# Patient Record
Sex: Male | Born: 1935
Health system: Southern US, Community
[De-identification: ages and names within clinical notes are randomized; demographics above are authoritative.]

## PROBLEM LIST (undated history)

## (undated) DIAGNOSIS — E785 Hyperlipidemia, unspecified: Secondary | ICD-10-CM

## (undated) DIAGNOSIS — I2510 Atherosclerotic heart disease of native coronary artery without angina pectoris: Secondary | ICD-10-CM

## (undated) DIAGNOSIS — I10 Essential (primary) hypertension: Secondary | ICD-10-CM

## (undated) HISTORY — DX: Essential (primary) hypertension: I10

## (undated) HISTORY — DX: Hyperlipidemia, unspecified: E78.5

## (undated) HISTORY — DX: Atherosclerotic heart disease of native coronary artery without angina pectoris: I25.10

---

## 1984-10-09 HISTORY — PX: KNEE SURGERY: SHX244

## 1998-01-26 ENCOUNTER — Inpatient Hospital Stay (HOSPITAL_COMMUNITY): Admission: EM | Admit: 1998-01-26 | Discharge: 1998-02-15 | Payer: Self-pay | Admitting: Emergency Medicine

## 1998-02-01 HISTORY — PX: CORONARY ARTERY BYPASS GRAFT: SHX141

## 2001-01-28 ENCOUNTER — Ambulatory Visit (HOSPITAL_COMMUNITY): Admission: RE | Admit: 2001-01-28 | Discharge: 2001-01-29 | Payer: Self-pay | Admitting: Cardiovascular Disease

## 2010-10-19 HISTORY — PX: US ECHOCARDIOGRAPHY: HXRAD669

## 2012-03-08 HISTORY — PX: NM MYOCAR PERF WALL MOTION: HXRAD629

## 2013-03-19 ENCOUNTER — Encounter: Payer: Self-pay | Admitting: Cardiovascular Disease

## 2013-03-31 ENCOUNTER — Telehealth: Payer: Self-pay | Admitting: Cardiovascular Disease

## 2013-03-31 NOTE — Telephone Encounter (Signed)
Wants lab results from about 2wks-going out of town on [place] like to know before he goes!

## 2013-03-31 NOTE — Telephone Encounter (Signed)
Message forwarded to W. Waddell, CMA.  

## 2013-04-23 ENCOUNTER — Telehealth: Payer: Self-pay | Admitting: Cardiovascular Disease

## 2013-04-23 NOTE — Telephone Encounter (Signed)
Wants lab results from about 2 months ago!

## 2013-04-24 NOTE — Telephone Encounter (Signed)
Informed patient Dr Tresa Endo hasn't reviewed labs yet, but there's nothing alarming on it that i could tell. If he wants him to do anything differently after he reviews labs, i will call back to inform.

## 2013-05-07 ENCOUNTER — Encounter: Payer: Self-pay | Admitting: *Deleted

## 2013-05-07 ENCOUNTER — Other Ambulatory Visit (HOSPITAL_COMMUNITY): Payer: Self-pay | Admitting: Cardiovascular Disease

## 2013-05-07 DIAGNOSIS — I2510 Atherosclerotic heart disease of native coronary artery without angina pectoris: Secondary | ICD-10-CM

## 2013-05-14 ENCOUNTER — Ambulatory Visit (HOSPITAL_COMMUNITY)
Admission: RE | Admit: 2013-05-14 | Discharge: 2013-05-14 | Disposition: A | Discharge status: Home / Self Care | Payer: Medicare Other | Source: Ambulatory Visit | Attending: Cardiovascular Disease | Admitting: Cardiovascular Disease

## 2013-05-14 DIAGNOSIS — I059 Rheumatic mitral valve disease, unspecified: Secondary | ICD-10-CM | POA: Insufficient documentation

## 2013-05-14 DIAGNOSIS — I517 Cardiomegaly: Secondary | ICD-10-CM | POA: Insufficient documentation

## 2013-05-14 DIAGNOSIS — I079 Rheumatic tricuspid valve disease, unspecified: Secondary | ICD-10-CM | POA: Insufficient documentation

## 2013-05-14 DIAGNOSIS — I2510 Atherosclerotic heart disease of native coronary artery without angina pectoris: Secondary | ICD-10-CM

## 2013-05-14 DIAGNOSIS — I359 Nonrheumatic aortic valve disorder, unspecified: Secondary | ICD-10-CM | POA: Insufficient documentation

## 2013-05-14 NOTE — Progress Notes (Signed)
[facility]   2D echo completed 05/14/2013.   Cindy Dixie Jafri, RDCS  

## 2013-05-23 ENCOUNTER — Encounter: Payer: Self-pay | Admitting: *Deleted

## 2013-06-04 ENCOUNTER — Other Ambulatory Visit (HOSPITAL_COMMUNITY): Payer: Self-pay | Admitting: Cardiovascular Disease

## 2013-06-05 NOTE — Telephone Encounter (Signed)
Rx was sent to pharmacy electronically. 

## 2013-06-18 ENCOUNTER — Other Ambulatory Visit (HOSPITAL_COMMUNITY): Payer: Self-pay | Admitting: Cardiovascular Disease

## 2013-08-11 ENCOUNTER — Other Ambulatory Visit (HOSPITAL_COMMUNITY): Payer: Self-pay | Admitting: Cardiovascular Disease

## 2013-08-11 NOTE — Telephone Encounter (Signed)
Rx was sent to pharmacy electronically. 

## 2013-09-01 ENCOUNTER — Other Ambulatory Visit (HOSPITAL_COMMUNITY): Payer: Self-pay | Admitting: Cardiovascular Disease

## 2013-09-01 NOTE — Telephone Encounter (Signed)
Rx was sent to pharmacy electronically. 

## 2013-09-08 ENCOUNTER — Ambulatory Visit (INDEPENDENT_AMBULATORY_CARE_PROVIDER_SITE_OTHER): Payer: Medicare Other | Admitting: Cardiovascular Disease

## 2013-09-08 ENCOUNTER — Encounter: Payer: Self-pay | Admitting: Cardiovascular Disease

## 2013-09-08 VITALS — BP 130/78 | HR 53 | Ht 68.0 in | Wt 178.6 lb

## 2013-09-08 DIAGNOSIS — I10 Essential (primary) hypertension: Secondary | ICD-10-CM

## 2013-09-08 DIAGNOSIS — N289 Disorder of kidney and ureter, unspecified: Secondary | ICD-10-CM

## 2013-09-08 DIAGNOSIS — I350 Nonrheumatic aortic (valve) stenosis: Secondary | ICD-10-CM

## 2013-09-08 DIAGNOSIS — I359 Nonrheumatic aortic valve disorder, unspecified: Secondary | ICD-10-CM

## 2013-09-08 DIAGNOSIS — E785 Hyperlipidemia, unspecified: Secondary | ICD-10-CM

## 2013-09-08 DIAGNOSIS — I2510 Atherosclerotic heart disease of native coronary artery without angina pectoris: Secondary | ICD-10-CM

## 2013-09-08 NOTE — Patient Instructions (Signed)
Your physician wants you to follow-up in:  6 months. You will receive a reminder letter in the mail two months in advance. If you don't receive a letter, please call our office to schedule the follow-up appointment.   

## 2013-09-09 ENCOUNTER — Encounter: Payer: Self-pay | Admitting: Cardiovascular Disease

## 2013-09-09 DIAGNOSIS — N289 Disorder of kidney and ureter, unspecified: Secondary | ICD-10-CM | POA: Insufficient documentation

## 2013-09-09 DIAGNOSIS — I350 Nonrheumatic aortic (valve) stenosis: Secondary | ICD-10-CM | POA: Insufficient documentation

## 2013-09-09 DIAGNOSIS — E785 Hyperlipidemia, unspecified: Secondary | ICD-10-CM | POA: Insufficient documentation

## 2013-09-09 DIAGNOSIS — I2510 Atherosclerotic heart disease of native coronary artery without angina pectoris: Secondary | ICD-10-CM | POA: Insufficient documentation

## 2013-09-09 DIAGNOSIS — I10 Essential (primary) hypertension: Secondary | ICD-10-CM | POA: Insufficient documentation

## 2013-09-09 NOTE — Progress Notes (Signed)
Patient ID: Douglas Bray, male   DOB: [birth year], 79 y.o.   MRN: [id number]     HPI: Douglas Bray is a 79 y.o. male who presents for nine-month cardiology evaluation.  Douglas Bray has known coronary artery disease in April 1999 underwent CABG revascularization surgery LIMA to the LAD, vein to the diagonal, vein to the marginal, and vein to the PDA an optional diagonal vessel. In April 2002 he underwent complex 2 vessel intervention to the diagonal vessel with stenting as well as stenting to the circumflex graft. In January 2012 echo Doppler study suggested normal systolic function with diastolic dysfunction. He had moderate to severe tricuspid regurgitation with mild pulmonary hypertension, mild aortic insufficiency, mild mitral regurgitation.  In May 2013 2 episodes of left arm discomfort a nuclear perfusion study was performed which continued to show normal perfusion.  He recently underwent a echo Doppler study on 05/14/2013. This showed mild left jugular hypertrophy with normal systolic function. Diastolic parameters were normal. There was very mild aortic stenosis with mildly thickened trileaflet valve with mild AR. He had mild mitral regurgitation, moderately severe tricuspid regurgitation and mild pulmonary hypertension with a PA pressure 37 mm. His mean aortic gradient was 7 with a peak gradient of 16.  Additional problems include hypertension and hyperlipidemia.   Allergies  Allergen Reactions  . Ramipril Cough    Current Outpatient Prescriptions  Medication Sig Dispense Refill  . aspirin EC 81 MG tablet Take 81 mg by mouth daily.      Marland Kitchen atenolol (TENORMIN) 25 MG tablet Take 0.5 tablets (12.5 mg total) by mouth daily.  45 tablet  1  . Fish Oil OIL Take 1 capsule by mouth 2 (two) times daily.      . isosorbide mononitrate (IMDUR) 60 MG 24 hr tablet TAKE ONE (1) TABLET BY MOUTH EVERY DAY  90 tablet  0  . quinapril (ACCUPRIL) 5 MG tablet TAKE ONE (1) TABLET BY MOUTH EVERY  DAY  90 tablet  1  . simvastatin (ZOCOR) 40 MG tablet TAKE ONE (1) TABLET BY MOUTH EVERY DAY  90 tablet  2   No current facility-administered medications for this visit.    History   Social History  . Marital Status: Married    Spouse Name: N/A    Number of Children: N/A  . Years of Education: N/A   Occupational History  . Not on file.   Social History Main Topics  . Smoking status: Former Games developer  . Smokeless tobacco: Current User    Types: Chew     Comment: quit smoking 50 years ago. chews occas.  . Alcohol Use: No  . Drug Use: Not on file  . Sexual Activity: Not on file   Other Topics Concern  . Not on file   Social History Narrative  . No narrative on file   Social history is notable in that he is married has 2 children, one deceased, 5 grandchildren 8 great-grandchildren. There is no call use. He previously chewed tobacco. His wife did suffer a CVA.  History reviewed. No pertinent family history.  ROS is negative for fevers, chills or night sweats. He denies skin rash. He denies hearing issues. He denies awareness of lymphadenopathy. He denies cough increased sputum production. He denies wheezing. He denies shortness of breath. He denies presyncope or syncope. He denies chest pain. He denies nausea vomiting diarrhea. He denies blood in stool or urine. He denies myalgias. He denies paresthesias. There is no diabetes. He is  unaware of cold intolerance or other endocrine problems.   Other comprehensive 12 point system review is negative.  PE BP 130/78  Pulse 53  Ht 5\' 8"  (1.727 m)  Wt 178 lb 9.6 oz (81.012 kg)  BMI 27.16 kg/m2  General: Alert, oriented, no distress.  Skin: normal turgor, no rashes HEENT: Normocephalic, atraumatic. Pupils round and reactive; sclera anicteric;no lid lag.  Nose without nasal septal hypertrophy Mouth/Parynx benign; Mallinpatti scale 2 Neck: No JVD, no carotid briuts Lungs: clear to ausculatation and percussion; no wheezing or  rales Heart: RRR, s1 s2 normal 2/6 systolic murmur in aortic region concordant with his mild aortic stenosis. No S3 or S4 gallop or Abdomen: soft, nontender; no hepatosplenomehaly, BS+; abdominal aorta nontender and not dilated by palpation. Pulses 2+ Extremities: no clubbing cyanosis or edema, Homan's sign negative  Neurologic: grossly nonfocal Psychologic: normal affect and mood.  ECG: Sinus rhythm at 53 beats per minute. No ectopy. Normal intervals.  LABS:  BMET No results found for this basename: na, k, cl, co2, glucose, bun, creatinine, calcium, gfrnonaa, gfraa     Hepatic Function Panel  No results found for this basename: prot, albumin, ast, alt, alkphos, bilitot, bilidir, ibili     CBC No results found for this basename: wbc, rbc, hgb, hct, plt, mcv, mch, mchc, rdw, neutrabs, lymphsabs, monoabs, eosabs, basosabs     BNP No results found for this basename: probnp    Lipid Panel  No results found for this basename: chol, trig, hdl, cholhdl, vldl, ldlcalc     RADIOLOGY: No results found.    ASSESSMENT AND PLAN:  My impression is that Douglas Bray is an 79 year old gentleman who is now 15 years status post CBG revascularization surgery. His last nuclear perfusion study in May 2013 continued to show fairly normal perfusion without ischemia. I did review his recent echo Doppler study with him in detail. He does have very mild aortic stenosis and has normal systolic function. As last laboratory 6 months ago, cholesterol is excellent at 138 with an HDL 45 triglycerides 139 LDL cholesterol 65. Normal chemistry profile though cracking was minimally increased at 1.41. He continues to be very active with walking outside, and hunting. He continue current medical regimen. I will see him in 6 months for cardiology reevaluation.    Lennette Bihari, MD, [facility]  09/09/2013 3:24 PM

## 2013-09-10 ENCOUNTER — Encounter: Payer: Self-pay | Admitting: Cardiovascular Disease

## 2013-10-22 ENCOUNTER — Other Ambulatory Visit (HOSPITAL_COMMUNITY): Payer: Self-pay | Admitting: Cardiovascular Disease

## 2013-10-22 NOTE — Telephone Encounter (Signed)
Rx was sent to pharmacy electronically. 

## 2014-01-06 ENCOUNTER — Other Ambulatory Visit: Payer: Self-pay | Admitting: *Deleted

## 2014-01-06 ENCOUNTER — Telehealth: Payer: Self-pay | Admitting: *Deleted

## 2014-01-06 DIAGNOSIS — R5381 Other malaise: Secondary | ICD-10-CM

## 2014-01-06 DIAGNOSIS — E782 Mixed hyperlipidemia: Secondary | ICD-10-CM

## 2014-01-06 DIAGNOSIS — R5383 Other fatigue: Secondary | ICD-10-CM

## 2014-01-06 DIAGNOSIS — I10 Essential (primary) hypertension: Secondary | ICD-10-CM

## 2014-01-06 DIAGNOSIS — Z79899 Other long term (current) drug therapy: Secondary | ICD-10-CM

## 2014-01-06 NOTE — Telephone Encounter (Signed)
Informed patient lab slips will be sent to him.

## 2014-01-06 NOTE — Telephone Encounter (Signed)
Pt was calling in regards to his lab work. He stated that if he needs lab work if you can please send him a lab slip.  TK

## 2014-02-05 ENCOUNTER — Encounter: Payer: Self-pay | Admitting: *Deleted

## 2014-02-06 ENCOUNTER — Encounter: Payer: Self-pay | Admitting: Cardiovascular Disease

## 2014-02-06 ENCOUNTER — Ambulatory Visit (INDEPENDENT_AMBULATORY_CARE_PROVIDER_SITE_OTHER): Payer: Medicare Other | Admitting: Cardiovascular Disease

## 2014-02-06 VITALS — BP 112/62 | HR 53 | Ht 68.0 in | Wt 173.8 lb

## 2014-02-06 DIAGNOSIS — I359 Nonrheumatic aortic valve disorder, unspecified: Secondary | ICD-10-CM

## 2014-02-06 DIAGNOSIS — E785 Hyperlipidemia, unspecified: Secondary | ICD-10-CM

## 2014-02-06 DIAGNOSIS — I10 Essential (primary) hypertension: Secondary | ICD-10-CM

## 2014-02-06 DIAGNOSIS — I350 Nonrheumatic aortic (valve) stenosis: Secondary | ICD-10-CM

## 2014-02-06 DIAGNOSIS — I2510 Atherosclerotic heart disease of native coronary artery without angina pectoris: Secondary | ICD-10-CM

## 2014-02-06 NOTE — Patient Instructions (Signed)
Your physician has requested that you have an echocardiogram. Echocardiography is a painless test that uses sound waves to create images of your heart. It provides your doctor with information about the size and shape of your heart and how well your heart's chambers and valves are working. This procedure takes approximately one hour. There are no restrictions for this procedure. This will be scheduled in January office visit to follow.

## 2014-02-15 ENCOUNTER — Encounter: Payer: Self-pay | Admitting: Cardiovascular Disease

## 2014-02-15 NOTE — Progress Notes (Signed)
Patient ID: Douglas Bray, male   DOB: [birth year], 81 y.o.   MRN: [id number]     HPI: Douglas Bray is a 80 y.o. male who presents for 6 month cardiology evaluation.  Douglas Bray has known coronary artery disease in April 1999 underwent CABG revascularization surgery LIMA to the LAD, vein to the diagonal, vein to the marginal, and vein to the PDA as well as optional diagonal vessel. In April 2002 he underwent complex 2 vessel intervention to the diagonal vessel with stenting as well as stenting to the circumflex graft. In January 2012 echo Doppler study suggested normal systolic function with diastolic dysfunction. He had moderate to severe tricuspid regurgitation with mild pulmonary hypertension, mild aortic insufficiency, mild mitral regurgitation.  In May 2013 due to episodes of left arm discomfort a nuclear perfusion study was performed which continued to show normal perfusion.  He  underwent a echo Doppler study on 05/14/2013 which revealed mild LVHwith normal systolic function. Diastolic parameters were normal. There was very mild aortic stenosis with mildly thickened trileaflet valve with mild AR. He had mild mitral regurgitation, moderately severe tricuspid regurgitation and mild pulmonary hypertension with a PA pressure 37 mm. His mean aortic gradient was 7 with a peak gradient of 16.  Additional problems include hypertension and hyperlipidemia.  As I last saw him, but he denies any episodes of chest pain or shortness of breath.  He has been taking simvastatin 40 mg for hyperlipidemia.  He also is on Accupril 5 mg, atenolol 12.5 mg for blood pressure and also has been taking 60 mg of isosorbide mononitrate.  He presents for 6 month evaluation.   Allergies  Allergen Reactions  . Ramipril Cough    Current Outpatient Prescriptions  Medication Sig Dispense Refill  . aspirin EC 81 MG tablet Take 81 mg by mouth daily.      Marland Kitchen atenolol (TENORMIN) 25 MG tablet Take 0.5 tablets (12.5  mg total) by mouth daily.  45 tablet  1  . Fish Oil OIL Take 1 capsule by mouth 2 (two) times daily.      . isosorbide mononitrate (IMDUR) 60 MG 24 hr tablet TAKE ONE (1) TABLET BY MOUTH EVERY DAY  90 tablet  0  . quinapril (ACCUPRIL) 5 MG tablet TAKE ONE (1) TABLET BY MOUTH EVERY DAY  90 tablet  3  . simvastatin (ZOCOR) 40 MG tablet TAKE ONE (1) TABLET BY MOUTH EVERY DAY  90 tablet  2   No current facility-administered medications for this visit.    History   Social History  . Marital Status: Married    Spouse Name: N/A    Number of Children: N/A  . Years of Education: N/A   Occupational History  . Not on file.   Social History Main Topics  . Smoking status: Former Research scientist (life sciences)  . Smokeless tobacco: Current User    Types: Chew     Comment: quit smoking 50 years ago. chews occas.  . Alcohol Use: No  . Drug Use: No  . Sexual Activity: Not on file   Other Topics Concern  . Not on file   Social History Narrative  . No narrative on file   Social history is notable in that he is married has 2 children, one deceased, 5 grandchildren 8 great-grandchildren. There is no call use. He previously chewed tobacco. His wife did suffer a CVA.  History reviewed. No pertinent family history.   ROS General: Negative; No fevers, chills, or night sweats;  HEENT: Negative; No changes in vision or hearing, sinus congestion, difficulty swallowing Pulmonary: Negative; No cough, wheezing, shortness of breath, hemoptysis Cardiovascular: Negative; No chest pain, presyncope, syncope, palpatations GI: Negative; No nausea, vomiting, diarrhea, or abdominal pain GU: Negative; No dysuria, hematuria, or difficulty voiding Musculoskeletal: Negative; no myalgias, joint pain, or weakness Hematologic/Oncology: Negative; no easy bruising, bleeding Endocrine: Negative; no heat/cold intolerance; no diabetes Neuro: Negative; no changes in balance, headaches Skin: Negative; No rashes or skin lesions Psychiatric:  Negative; No behavioral problems, depression Sleep: Negative; No snoring, daytime sleepiness, hypersomnolence, bruxism, restless legs, hypnogognic hallucinations, no cataplexy Other comprehensive 14 point system review is negative.   PE BP 112/62  Pulse 53  Ht 5\' 8"  (1.727 m)  Wt 173 lb 12.8 oz (78.835 kg)  BMI 26.43 kg/m2  General: Alert, oriented, no distress.  Skin: normal turgor, no rashes HEENT: Normocephalic, atraumatic. Pupils round and reactive; sclera anicteric;no lid lag.  Nose without nasal septal hypertrophy Mouth/Parynx benign; Mallinpatti scale 2 Neck: No JVD, no carotid bruits with normal carotid upstroke Lungs: clear to ausculatation and percussion; no wheezing or rales Chest wall: Nontender to palpation Heart: RRR, s1 s2 normal 2/6 systolic murmur in aortic region concordant with his mild aortic stenosis. No S3 or S4 gallop; no diastolic murmur, no rubs, thrills or heaves. Abdomen: soft, nontender; no hepatosplenomehaly, BS+; abdominal aorta nontender and not dilated by palpation. Back: No CVA tenderness Pulses 2+ Extremities: no clubbing cyanosis or edema, Homan's sign negative  Neurologic: grossly nonfocal Psychologic: normal affect and mood.  ECG (independently read by me): Sinus bradycardia 53 beats per minute.  PR interval 176 ms, QTc interval normal at 470 ms.  No significant ST-T changes.  Prior December 2014 ECG: Sinus rhythm at 53 beats per minute. No ectopy. Normal intervals.  LABS:  BMET No results found for this basename: na,  k,  cl,  co2,  glucose,  bun,  creatinine,  calcium,  gfrnonaa,  gfraa     Hepatic Function Panel  No results found for this basename: prot,  albumin,  ast,  alt,  alkphos,  bilitot,  bilidir,  ibili     CBC No results found for this basename: wbc,  rbc,  hgb,  hct,  plt,  mcv,  mch,  mchc,  rdw,  neutrabs,  lymphsabs,  monoabs,  eosabs,  basosabs     BNP No results found for this basename: probnp    Lipid Panel   No results found for this basename: chol,  trig,  hdl,  cholhdl,  vldl,  ldlcalc     RADIOLOGY: No results found.    ASSESSMENT AND PLAN:  Douglas Bray is an 80 year old gentleman who is 16 years status post CBG revascularization surgery. His last nuclear perfusion study in May 2013 continued to show fairly normal perfusion without ischemia.  On his echo in August 2014 he had very mild aortic stenosis and has normal systolic function.  I have reviewed recent blood work that he had done on 01/30/2014 [place].  This revealed a normal hemoglobin at 13.3, hematocrit 40.8.  Chemistry profile was normal.  Serum creatinine is now 1.26.  Lipid studies were excellent with a total cholesterol 139, triglycerides 147, HDL 44, LDL 66.  His normal liver function.  Thyroid function studies were normal.  He is tolerating simvastatin, with excellent results.  His blood pressure today is well controlled on Accupril in addition to low dose atenolol.  He is bradycardic.  He is asymptomatic with  reference to his heart rate.  He does have physical examination murmur, concordant with his aortic valve stenosis.  Presently, he remains asymptomatic with reference to this aortic stenosis.  In January he will undergo a one half year followup echo Doppler evaluation of seen back in the office for followup office visit.   Troy Sine, MD, [facility]  02/15/2014 4:48 PM

## 2014-03-04 ENCOUNTER — Other Ambulatory Visit: Payer: Self-pay | Admitting: Cardiovascular Disease

## 2014-03-04 NOTE — Telephone Encounter (Signed)
Rx was sent to pharmacy electronically. 

## 2014-03-09 ENCOUNTER — Other Ambulatory Visit (HOSPITAL_COMMUNITY): Payer: Self-pay | Admitting: Cardiovascular Disease

## 2014-03-09 NOTE — Telephone Encounter (Signed)
Rx was sent to pharmacy electronically. 

## 2014-03-30 ENCOUNTER — Encounter: Payer: Self-pay | Admitting: Cardiovascular Disease

## 2014-05-04 ENCOUNTER — Other Ambulatory Visit (HOSPITAL_COMMUNITY): Payer: Self-pay | Admitting: Cardiovascular Disease

## 2014-12-21 ENCOUNTER — Other Ambulatory Visit (HOSPITAL_COMMUNITY): Payer: Self-pay | Admitting: Cardiovascular Disease

## 2014-12-21 NOTE — Telephone Encounter (Signed)
Rx refill sent to patient pharmacy   

## 2015-01-05 ENCOUNTER — Telehealth: Payer: Self-pay | Admitting: Cardiovascular Disease

## 2015-01-05 DIAGNOSIS — Z79899 Other long term (current) drug therapy: Secondary | ICD-10-CM

## 2015-01-05 DIAGNOSIS — I10 Essential (primary) hypertension: Secondary | ICD-10-CM

## 2015-01-05 DIAGNOSIS — R531 Weakness: Secondary | ICD-10-CM

## 2015-01-05 DIAGNOSIS — E782 Mixed hyperlipidemia: Secondary | ICD-10-CM

## 2015-01-05 NOTE — Telephone Encounter (Addendum)
Lab orders generated, copies put in outgoing mail for patient. Called pt to notify.

## 2015-01-05 NOTE — Telephone Encounter (Signed)
Mr. Mccanless is needing a lab order sent to him before his appt on 01/11/15 at 9: 45am   Thanks

## 2015-01-11 ENCOUNTER — Ambulatory Visit (INDEPENDENT_AMBULATORY_CARE_PROVIDER_SITE_OTHER): Payer: Medicare Other | Admitting: Cardiovascular Disease

## 2015-01-11 ENCOUNTER — Other Ambulatory Visit: Payer: Self-pay | Admitting: Cardiovascular Disease

## 2015-01-11 ENCOUNTER — Encounter (HOSPITAL_COMMUNITY): Payer: Self-pay | Admitting: *Deleted

## 2015-01-11 VITALS — BP 106/64 | HR 50 | Ht 68.0 in | Wt 179.0 lb

## 2015-01-11 DIAGNOSIS — Z79899 Other long term (current) drug therapy: Secondary | ICD-10-CM | POA: Diagnosis not present

## 2015-01-11 DIAGNOSIS — I350 Nonrheumatic aortic (valve) stenosis: Secondary | ICD-10-CM

## 2015-01-11 DIAGNOSIS — R531 Weakness: Secondary | ICD-10-CM | POA: Diagnosis not present

## 2015-01-11 DIAGNOSIS — E782 Mixed hyperlipidemia: Secondary | ICD-10-CM | POA: Diagnosis not present

## 2015-01-11 DIAGNOSIS — I10 Essential (primary) hypertension: Secondary | ICD-10-CM

## 2015-01-11 DIAGNOSIS — E785 Hyperlipidemia, unspecified: Secondary | ICD-10-CM | POA: Diagnosis not present

## 2015-01-11 DIAGNOSIS — I25810 Atherosclerosis of coronary artery bypass graft(s) without angina pectoris: Secondary | ICD-10-CM

## 2015-01-11 LAB — COMPREHENSIVE METABOLIC PANEL
ALT: 14 U/L (ref 0–53)
AST: 20 U/L (ref 0–37)
Albumin: 4.3 g/dL (ref 3.5–5.2)
Alkaline Phosphatase: 38 U/L — ABNORMAL LOW (ref 39–117)
BUN: 17 mg/dL (ref 6–23)
CALCIUM: 9.6 mg/dL (ref 8.4–10.5)
CHLORIDE: 103 meq/L (ref 96–112)
CO2: 29 mEq/L (ref 19–32)
Creat: 1.23 mg/dL (ref 0.50–1.35)
Glucose, Bld: 87 mg/dL (ref 70–99)
Potassium: 5.2 mEq/L (ref 3.5–5.3)
Sodium: 141 mEq/L (ref 135–145)
Total Bilirubin: 0.8 mg/dL (ref 0.2–1.2)
Total Protein: 7.1 g/dL (ref 6.0–8.3)

## 2015-01-11 LAB — LIPID PANEL
CHOLESTEROL: 137 mg/dL (ref 0–200)
HDL: 37 mg/dL — AB (ref >=40)
LDL CALC: 64 mg/dL (ref 0–99)
Total CHOL/HDL Ratio: 3.7 Ratio
Triglycerides: 180 mg/dL — ABNORMAL HIGH (ref <150)
VLDL: 36 mg/dL (ref 0–40)

## 2015-01-11 LAB — CBC
HCT: 42.4 % (ref 39.0–52.0)
Hemoglobin: 14.6 g/dL (ref 13.0–17.0)
MCH: 32.6 pg (ref 26.0–34.0)
MCHC: 34.4 g/dL (ref 30.0–36.0)
MCV: 94.6 fL (ref 78.0–100.0)
MPV: 9.6 fL (ref 8.6–12.4)
Platelets: 187 10*3/uL (ref 150–400)
RBC: 4.48 MIL/uL (ref 4.22–5.81)
RDW: 14.1 % (ref 11.5–15.5)
WBC: 7.9 10*3/uL (ref 4.0–10.5)

## 2015-01-11 LAB — TSH: TSH: 4.347 u[IU]/mL (ref 0.350–4.500)

## 2015-01-11 NOTE — Patient Instructions (Addendum)
Your physician has requested that you have an echocardiogram. Echocardiography is a painless test that uses sound waves to create images of your heart. It provides your doctor with information about the size and shape of your heart and how well your heart's chambers and valves are working. This procedure takes approximately one hour. There are no restrictions for this procedur for this exam.   Your physician has requested that you have a lexiscan myoview. For further information please visit [email or web address]. Please follow instruction sheet, as given.   Your physician recommends that you return for lab work.  Your physician recommends that you schedule a follow-up appointment in: 2 months.

## 2015-01-13 ENCOUNTER — Encounter: Payer: Self-pay | Admitting: Cardiovascular Disease

## 2015-01-13 NOTE — Progress Notes (Signed)
Patient ID: Douglas Bray, male   DOB: 04-May-1929, 81 y.o.   MRN: [id number]     HPI: Douglas Bray is a 81 y.o. male who presents for an 40 month cardiology evaluation.  Douglas Bray has known CAD and in April 1999 underwent CABG revascularization surgery LIMA to the LAD, vein to the diagonal, vein to the marginal, and vein to the PDA as well as optional diagonal vessel. In April 2002 he underwent complex 2 vessel intervention to the diagonal vessel with stenting as well as stenting to the circumflex graft. In January 2012 echo Doppler study suggested normal systolic function with diastolic dysfunction. He had moderate to severe tricuspid regurgitation with mild pulmonary hypertension, mild aortic insufficiency, mild mitral regurgitation.  In May 2013 due to episodes of left arm discomfort a nuclear perfusion study was performed which continued to show normal perfusion.  An echo Doppler study on 05/14/2013  revealed mild LVH with normal systolic function. Diastolic parameters were normal. There was very mild aortic stenosis with mildly thickened trileaflet valve with mild AR. He had mild mitral regurgitation, moderately severe tricuspid regurgitation and mild pulmonary hypertension with a PA pressure 37 mm. His mean aortic gradient was 7 with a peak gradient of 16.  Additional problems include hypertension and hyperlipidemia.  Since I last saw him, he has remained active.  He denies chest pain, shortness of breath or palpitations.  He has a tear in his right shoulder for which is being followed by Dr. Gladstone Lighter.  Allergies  Allergen Reactions  . Ramipril Cough    Current Outpatient Prescriptions  Medication Sig Dispense Refill  . aspirin EC 81 MG tablet Take 81 mg by mouth daily.    Marland Kitchen atenolol (TENORMIN) 25 MG tablet TAKE ONE-HALF TABLET ONCE DAILY 45 tablet 5  . Fish Oil OIL Take 1 capsule by mouth 2 (two) times daily.    . isosorbide mononitrate (IMDUR) 60 MG 24 hr tablet TAKE ONE (1)  TABLET BY MOUTH EVERY DAY 90 tablet 3  . quinapril (ACCUPRIL) 5 MG tablet TAKE ONE (1) TABLET EACH DAY 90 tablet 0  . simvastatin (ZOCOR) 40 MG tablet TAKE ONE (1) TABLET BY MOUTH EVERY DAY 90 tablet 3   No current facility-administered medications for this visit.    History   Social History  . Marital Status: Married    Spouse Name: N/A  . Number of Children: N/A  . Years of Education: N/A   Occupational History  . Not on file.   Social History Main Topics  . Smoking status: Former Research scientist (life sciences)  . Smokeless tobacco: Current User    Types: Chew     Comment: quit smoking 50 years ago. chews occas.  . Alcohol Use: No  . Drug Use: No  . Sexual Activity: Not on file   Other Topics Concern  . Not on file   Social History Narrative   Social history is notable in that he is married has 2 children, one deceased, 5 grandchildren 8 great-grandchildren. There is no call use. He previously chewed tobacco. His wife did suffer a CVA.  History reviewed. No pertinent family history.   ROS General: Negative; No fevers, chills, or night sweats;  HEENT: Negative; No changes in vision or hearing, sinus congestion, difficulty swallowing Pulmonary: Negative; No cough, wheezing, shortness of breath, hemoptysis Cardiovascular: Negative; No chest pain, presyncope, syncope, palpatations GI: Negative; No nausea, vomiting, diarrhea, or abdominal pain GU: Negative; No dysuria, hematuria, or difficulty voiding Musculoskeletal: Negative; no myalgias,  joint pain, or weakness Hematologic/Oncology: Negative; no easy bruising, bleeding Endocrine: Negative; no heat/cold intolerance; no diabetes Neuro: Negative; no changes in balance, headaches Skin: Negative; No rashes or skin lesions Psychiatric: Negative; No behavioral problems, depression Sleep: Negative; No snoring, daytime sleepiness, hypersomnolence, bruxism, restless legs, hypnogognic hallucinations, no cataplexy Other comprehensive 14 point system  review is negative.   PE BP 106/64 mmHg  Pulse 50  Ht '5\' 8"'  (1.727 m)  Wt 179 lb (81.194 kg)  BMI 27.22 kg/m2  General: Alert, oriented, no distress.  Skin: normal turgor, no rashes HEENT: Normocephalic, atraumatic. Pupils round and reactive; sclera anicteric;no lid lag.  Nose without nasal septal hypertrophy Mouth/Parynx benign; Mallinpatti scale 2 Neck: No JVD, no carotid bruits with normal carotid upstroke Lungs: clear to ausculatation and percussion; no wheezing or rales Chest wall: Nontender to palpation Heart: RRR, s1 s2 normal 2/6 systolic murmur in aortic region concordant with his aortic stenosis. No S3 or S4 gallop; no diastolic murmur, no rubs, thrills or heaves. Abdomen: soft, nontender; no hepatosplenomehaly, BS+; abdominal aorta nontender and not dilated by palpation. Back: No CVA tenderness Pulses 2+ Extremities: no clubbing cyanosis or edema, Homan's sign negative  Neurologic: grossly nonfocal Psychologic: normal affect and mood.  ECG (independently read by me): Sinus bradycardia 50 bpm.  Poor progression V1 through V3.  No significant ST segment changes  May 2015 ECG (independently read by me): Sinus bradycardia 53 beats per minute.  PR interval 176 ms, QTc interval normal at 470 ms.  No significant ST-T changes.  Prior December 2014 ECG: Sinus rhythm at 53 beats per minute. No ectopy. Normal intervals.  LABS:  BMET  BMP Latest Ref Rng 01/11/2015  Glucose 70 - 99 mg/dL 87  BUN 6 - 23 mg/dL 17  Creatinine 0.50 - 1.35 mg/dL 1.23  Sodium 135 - 145 mEq/L 141  Potassium 3.5 - 5.3 mEq/L 5.2  Chloride 96 - 112 mEq/L 103  CO2 19 - 32 mEq/L 29  Calcium 8.4 - 10.5 mg/dL 9.6     Hepatic Function Panel   Hepatic Function Latest Ref Rng 01/11/2015  Total Protein 6.0 - 8.3 g/dL 7.1  Albumin 3.5 - 5.2 g/dL 4.3  AST 0 - 37 U/L 20  ALT 0 - 53 U/L 14  Alk Phosphatase 39 - 117 U/L 38(L)  Total Bilirubin 0.2 - 1.2 mg/dL 0.8     CBC    Component Value Date/Time    WBC 7.9 01/11/2015 1127   CBC Latest Ref Rng 01/11/2015  WBC 4.0 - 10.5 K/uL 7.9  Hemoglobin 13.0 - 17.0 g/dL 14.6  Hematocrit 39.0 - 52.0 % 42.4  Platelets 150 - 400 K/uL 187      BNP No results found for: PROBNP   Lipid Panel     Component Value Date/Time   CHOL 137 01/11/2015 1127   TRIG 180* 01/11/2015 1127   HDL 37* 01/11/2015 1127   CHOLHDL 3.7 01/11/2015 1127   VLDL 36 01/11/2015 1127   LDLCALC 64 01/11/2015 1127    RADIOLOGY: No results found.    ASSESSMENT AND PLAN:  Douglas Bray is an 81 year old gentleman who is 17 years status post CABG revascularization surgery. His last nuclear perfusion study in May 2013 continued to show fairly normal perfusion without ischemia.  On his echo in August 2014 he had very mild aortic stenosis with  normal systolic function.  Physical exam reveals a 2/6 systolic murmur.  I am recommending a two-year follow-up evaluation of his aortic  stenosis with an echo Doppler evaluation.  I am also recommending a three-year follow-up Lexiscan Myoview study to further evaluate his coronary anatomy/grafts, which will be 3 years after his last evaluation.  His blood pressure today is controlled on Accupril 5 mg, 8 atenolol 25 mg daily in addition to his isosorbide.  He denies any anginal symptoms.  He is on simvastatin 40 mg for hyperlipidemia.  I am recomming laboratory be obtained in the fasting state.  Adjustments will be made to his medical regimen.  I will see him in 2 months for reevaluation or sooner if problems arise.  Troy Sine, MD, [facility]  01/13/2015 6:08 PM

## 2015-01-21 ENCOUNTER — Telehealth (HOSPITAL_COMMUNITY): Payer: Self-pay

## 2015-01-21 NOTE — Telephone Encounter (Signed)
Encounter complete. 

## 2015-01-27 ENCOUNTER — Ambulatory Visit (HOSPITAL_COMMUNITY)
Admission: RE | Admit: 2015-01-27 | Discharge: 2015-01-27 | Disposition: A | Discharge status: Home / Self Care | Payer: Medicare Other | Source: Ambulatory Visit | Attending: Cardiovascular Disease | Admitting: Cardiovascular Disease

## 2015-01-27 DIAGNOSIS — I270 Primary pulmonary hypertension: Secondary | ICD-10-CM | POA: Insufficient documentation

## 2015-01-27 DIAGNOSIS — I071 Rheumatic tricuspid insufficiency: Secondary | ICD-10-CM | POA: Insufficient documentation

## 2015-01-27 DIAGNOSIS — I2510 Atherosclerotic heart disease of native coronary artery without angina pectoris: Secondary | ICD-10-CM | POA: Diagnosis not present

## 2015-01-27 DIAGNOSIS — I10 Essential (primary) hypertension: Secondary | ICD-10-CM | POA: Insufficient documentation

## 2015-01-27 DIAGNOSIS — I25810 Atherosclerosis of coronary artery bypass graft(s) without angina pectoris: Secondary | ICD-10-CM | POA: Diagnosis not present

## 2015-01-27 DIAGNOSIS — Z87891 Personal history of nicotine dependence: Secondary | ICD-10-CM | POA: Insufficient documentation

## 2015-01-27 DIAGNOSIS — E785 Hyperlipidemia, unspecified: Secondary | ICD-10-CM | POA: Insufficient documentation

## 2015-01-27 MED ORDER — TECHNETIUM TC 99M SESTAMIBI GENERIC - CARDIOLITE
10.2000 | Freq: Once | INTRAVENOUS | Status: AC | PRN
  Administered 2015-01-27: 10 via INTRAVENOUS

## 2015-01-27 MED ORDER — TECHNETIUM TC 99M SESTAMIBI GENERIC - CARDIOLITE
30.9000 | Freq: Once | INTRAVENOUS | Status: AC | PRN
  Administered 2015-01-27: 30.9 via INTRAVENOUS

## 2015-01-27 MED ORDER — REGADENOSON 0.4 MG/5ML IV SOLN
0.4000 mg | Freq: Once | INTRAVENOUS | Status: AC
  Administered 2015-01-27: 0.4 mg via INTRAVENOUS

## 2015-01-27 NOTE — Procedures (Addendum)
[facility] [address] [place] [address] [phone]  Cardiology Nuclear Med Study  Douglas Bray is a 81 y.o. male     MRN : [id number]     DOB: 03-May-1929  Procedure Date: 01/27/2015  Nuclear Med Background Indication for Stress Test:  Follow up CAD History:  CAD;CABG X4-01/1998;STENT/PTCA-01/2001;Last NUC MPI on 02/22/2012-normal;EF=51%;Mild aortic stenosis;Renal Insuff. Cardiac Risk Factors: Family History - CAD, History of Smoking, Hypertension and Lipids  Symptoms:  DOE and Fatigue   Nuclear Pre-Procedure Caffeine/Decaff Intake:  8:00pm NPO After: 4:00am   IV Site: R Forearm  IV 0.9% NS with Angio Cath:  22g  Chest Size (in):  38" IV Started by: Rolene Course, RN  Height: 5\' 8"  (1.727 m)  Cup Size: n/a  BMI:  Body mass index is 27.22 kg/(m^2). Weight:  179 lb (81.194 kg)   Tech Comments:  n/a    Nuclear Med Study 1 or 2 day study: 1 day  Stress Test Type:  [place] Provider:  Shelva Majestic, MD   Resting Radionuclide: Technetium 11m Sestamibi  Resting Radionuclide Dose: 10.2 mCi   Stress Radionuclide:  Technetium 35m Sestamibi  Stress Radionuclide Dose: 30.9 mCi           Stress Protocol Rest HR: 51 Stress HR: 64  Rest BP: 134/80 Stress BP: 134/80  Exercise Time (min): n/a METS: n/a          Dose of Adenosine (mg):  n/a Dose of Lexiscan: 0.4 mg  Dose of Atropine (mg): n/a Dose of Dobutamine: n/a mcg/kg/min (at max HR)  Stress Test Technologist: Mellody Memos, CCT Nuclear Technologist: Imagene Riches, CNMT   Rest Procedure:  Myocardial perfusion imaging was performed at rest 45 minutes following the intravenous administration of Technetium 47m Sestamibi. Stress Procedure:  The patient received IV Lexiscan 0.4 mg over 15-seconds.  Technetium 69m Sestamibi injected IV at 30-seconds.  There were no significant changes with Lexiscan.  Quantitative spect images were obtained after a 45  minute delay.  Transient Ischemic Dilatation (Normal <1.22):  1.01 QGS EDV:  92 ml QGS ESV:  40 ml LV Ejection Fraction: 57%        Rest ECG: NSR - Normal EKG  Stress ECG: No significant change from baseline ECG  QPS Raw Data Images:  Normal; no motion artifact; normal heart/lung ratio. Stress Images:  Normal homogeneous uptake in all areas of the myocardium. Rest Images:  Normal homogeneous uptake in all areas of the myocardium. Subtraction (SDS):  No evidence of ischemia.  Impression Exercise Capacity:  Lexiscan with no exercise. BP Response:  Normal blood pressure response. Clinical Symptoms:  No significant symptoms noted. ECG Impression:  No significant ST segment change suggestive of ischemia. Comparison with Prior Nuclear Study: No significant change from previous study  Overall Impression:  Normal stress nuclear study.  LV Wall Motion:  NL LV Function; NL Wall Motion   Lorretta Harp, MD  01/27/2015 1:43 PM

## 2015-01-27 NOTE — Progress Notes (Signed)
2D Echocardiogram Complete.  01/27/2015   Terisha Losasso, RDCS  

## 2015-02-01 ENCOUNTER — Encounter: Payer: Self-pay | Admitting: *Deleted

## 2015-02-02 ENCOUNTER — Encounter: Payer: Self-pay | Admitting: *Deleted

## 2015-02-22 ENCOUNTER — Encounter: Payer: Self-pay | Admitting: *Deleted

## 2015-02-23 ENCOUNTER — Other Ambulatory Visit (HOSPITAL_COMMUNITY): Payer: Self-pay | Admitting: Cardiovascular Disease

## 2015-02-23 ENCOUNTER — Other Ambulatory Visit: Payer: Self-pay | Admitting: *Deleted

## 2015-02-23 MED ORDER — SIMVASTATIN 40 MG PO TABS: 40.0000 mg | ORAL_TABLET | Freq: Every day | ORAL | Status: DC

## 2015-03-24 ENCOUNTER — Other Ambulatory Visit (HOSPITAL_COMMUNITY): Payer: Self-pay | Admitting: Cardiovascular Disease

## 2015-03-24 NOTE — Telephone Encounter (Signed)
Rx(s) sent to pharmacy electronically.  

## 2015-04-10 ENCOUNTER — Other Ambulatory Visit: Payer: Self-pay | Admitting: Cardiovascular Disease

## 2015-04-13 NOTE — Telephone Encounter (Signed)
Rx(s) sent to pharmacy electronically.  

## 2015-05-24 ENCOUNTER — Other Ambulatory Visit (HOSPITAL_COMMUNITY): Payer: Self-pay | Admitting: Cardiovascular Disease

## 2015-05-24 NOTE — Telephone Encounter (Signed)
Rx(s) sent to pharmacy electronically.  

## 2015-07-16 ENCOUNTER — Other Ambulatory Visit: Payer: Self-pay | Admitting: Cardiovascular Disease

## 2015-07-26 DIAGNOSIS — Z23 Encounter for immunization: Secondary | ICD-10-CM | POA: Diagnosis not present

## 2015-08-10 ENCOUNTER — Telehealth: Payer: Self-pay

## 2015-08-10 MED ORDER — ISOSORBIDE MONONITRATE ER 60 MG PO TB24: 60.0000 mg | ORAL_TABLET | Freq: Every day | ORAL | Status: DC

## 2015-08-10 NOTE — Telephone Encounter (Signed)
Patient came to office with wife who had appointment.He stated he needed isosorbide refilled.90 day refill sent to pharmacy.Appointment scheduled with [facility] 08/31/15 at 2:15 pm.

## 2015-08-31 ENCOUNTER — Encounter: Payer: Self-pay | Admitting: Cardiovascular Disease

## 2015-08-31 ENCOUNTER — Other Ambulatory Visit: Payer: Self-pay | Admitting: *Deleted

## 2015-08-31 ENCOUNTER — Ambulatory Visit (INDEPENDENT_AMBULATORY_CARE_PROVIDER_SITE_OTHER): Payer: Medicare Other | Admitting: Cardiovascular Disease

## 2015-08-31 VITALS — BP 108/62 | HR 53 | Ht 66.0 in | Wt 169.3 lb

## 2015-08-31 DIAGNOSIS — Z01818 Encounter for other preprocedural examination: Secondary | ICD-10-CM | POA: Diagnosis not present

## 2015-08-31 DIAGNOSIS — R9431 Abnormal electrocardiogram [ECG] [EKG]: Secondary | ICD-10-CM | POA: Diagnosis not present

## 2015-08-31 DIAGNOSIS — R5383 Other fatigue: Secondary | ICD-10-CM | POA: Diagnosis not present

## 2015-08-31 DIAGNOSIS — D689 Coagulation defect, unspecified: Secondary | ICD-10-CM | POA: Diagnosis not present

## 2015-08-31 DIAGNOSIS — E785 Hyperlipidemia, unspecified: Secondary | ICD-10-CM

## 2015-08-31 DIAGNOSIS — I10 Essential (primary) hypertension: Secondary | ICD-10-CM

## 2015-08-31 DIAGNOSIS — I2510 Atherosclerotic heart disease of native coronary artery without angina pectoris: Secondary | ICD-10-CM

## 2015-08-31 DIAGNOSIS — I2583 Coronary atherosclerosis due to lipid rich plaque: Secondary | ICD-10-CM

## 2015-08-31 DIAGNOSIS — I350 Nonrheumatic aortic (valve) stenosis: Secondary | ICD-10-CM | POA: Diagnosis not present

## 2015-08-31 MED ORDER — CLOPIDOGREL BISULFATE 75 MG PO TABS: ORAL_TABLET | ORAL | Status: DC

## 2015-08-31 MED ORDER — ISOSORBIDE MONONITRATE ER 60 MG PO TB24: ORAL_TABLET | ORAL | Status: DC

## 2015-08-31 NOTE — Progress Notes (Signed)
Patient ID: Douglas Bray, male   DOB: August 19, 1929, 81 y.o.   MRN: [id number]     HPI: Douglas Bray is a 81 y.o. male who presents for an 7 month cardiology evaluation.  Mr. Douglas Bray has known CAD and in April 1999 underwent CABG revascularization surgery LIMA to the LAD, vein to the diagonal, vein to the marginal, and vein to the PDA as well as optional diagonal vessel. In April 2002 he underwent complex 2 vessel intervention to the diagonal vessel with stenting as well as stenting to the circumflex graft. In January 2012 echo Doppler study suggested normal systolic function with diastolic dysfunction. He had moderate to severe tricuspid regurgitation with mild pulmonary hypertension, mild aortic insufficiency, mild mitral regurgitation.  In May 2013 due to episodes of left arm discomfort a nuclear perfusion study was performed which continued to show normal perfusion.  An echo Doppler study on 05/14/2013  revealed mild LVH with normal systolic function. Diastolic parameters were normal. There was very mild aortic stenosis with mildly thickened trileaflet valve with mild AR. He had mild mitral regurgitation, moderately severe tricuspid regurgitation and mild pulmonary hypertension with a PA pressure 37 mm. His mean aortic gradient was 7 with a peak gradient of 16.  Additional problems include hypertension and hyperlipidemia.  Since I last saw him, he underwent a 2-D echo Doppler study in April 2016 which showed an ejection fraction of 55-60% with mild concentric LVH and grade 2 diastolic dysfunction.  His mean gradient across his aortic valve was 9 with a peak gradient of 17 and his aortic valve area range from 1.7-2.04 cm.  A nuclear perfusion study revealed normal perfusion.  [place], [place]  He has been working hard typically 12-14 hours a day.  He admits to increasing fatigue but denies chest pain or shortness of breath.  He is not resting well.  He is  unaware of palpitations.  He denies PND, orthopnea.  He presents for evaluation.  Allergies  Allergen Reactions  . Ramipril Cough    Current Outpatient Prescriptions  Medication Sig Dispense Refill  . aspirin EC 81 MG tablet Take 81 mg by mouth daily.    Marland Kitchen atenolol (TENORMIN) 25 MG tablet Take 0.5 tablets (12.5 mg total) by mouth daily. 45 tablet 2  . Fish Oil OIL Take 1 capsule by mouth 2 (two) times daily.    . isosorbide mononitrate (IMDUR) 60 MG 24 hr tablet Take 1 & 1/2 tablet daily. 45 tablet 6  . quinapril (ACCUPRIL) 5 MG tablet Take 1 tablet (5 mg total) by mouth daily. 90 tablet 3  . simvastatin (ZOCOR) 40 MG tablet Take 1 tablet (40 mg total) by mouth daily. 90 tablet 3  . clopidogrel (PLAVIX) 75 MG tablet TAKE 2 TABLETS TODAY THEN 1 TABLET DAILY THEREAFTER 32 tablet 6   No current facility-administered medications for this visit.    Social History   Social History  . Marital Status: Married    Spouse Name: N/A  . Number of Children: N/A  . Years of Education: N/A   Occupational History  . Not on file.   Social History Main Topics  . Smoking status: Former Research scientist (life sciences)  . Smokeless tobacco: Current User    Types: Chew     Comment: quit smoking 50 years ago. chews occas.  . Alcohol Use: No  . Drug Use: No  . Sexual Activity: Not on file   Other Topics Concern  . Not  on file   Social History Narrative   Social history is notable in that he is married has 2 children, one deceased, 5 grandchildren 25 great-grandchildren. There is no call use. He previously chewed tobacco. His wife did suffer a CVA.  No family history on file.  Both parents are deceased.  He has 4 living brothers and one deceased.  ROS General: Negative; No fevers, chills, or night sweats;  HEENT: Negative; No changes in vision or hearing, sinus congestion, difficulty swallowing Pulmonary: Negative; No cough, wheezing, shortness of breath, hemoptysis Cardiovascular: Negative; No chest pain,  presyncope, syncope, palpatations GI: Negative; No nausea, vomiting, diarrhea, or abdominal pain GU: Negative; No dysuria, hematuria, or difficulty voiding Musculoskeletal: Negative; no myalgias, joint pain, or weakness Hematologic/Oncology: Negative; no easy bruising, bleeding Endocrine: Negative; no heat/cold intolerance; no diabetes Neuro: Negative; no changes in balance, headaches Skin: Negative; No rashes or skin lesions Psychiatric: Negative; No behavioral problems, depression Sleep: Negative; No snoring, daytime sleepiness, hypersomnolence, bruxism, restless legs, hypnogognic hallucinations, no cataplexy Other comprehensive 14 point system review is negative.   PE BP 108/62 mmHg  Pulse 53  Ht _0  (1.676 m)  Wt 169 lb 4.8 oz (76.794 kg)  BMI 27.34 kg/m2   Wt Readings from Last 3 Encounters:  08/31/15 169 lb 4.8 oz (76.794 kg)  01/27/15 179 lb (81.194 kg)  01/11/15 179 lb (81.194 kg)   General: Alert, oriented, no distress.  Skin: normal turgor, no rashes HEENT: Normocephalic, atraumatic. Pupils round and reactive; sclera anicteric;no lid lag.  Nose without nasal septal hypertrophy Mouth/Parynx benign; Mallinpatti scale 2 Neck: No JVD, no carotid bruits with normal carotid upstroke Lungs: clear to ausculatation and percussion; no wheezing or rales Chest wall: Nontender to palpation Heart: RRR, s1 s2 normal 2/6 systolic murmur in aortic region concordant with his aortic stenosis. No S3 or S4 gallop; no diastolic murmur, no rubs, thrills or heaves. Abdomen: soft, nontender; no hepatosplenomehaly, BS+; abdominal aorta nontender and not dilated by palpation. Back: No CVA tenderness Pulses 2+ Extremities: no clubbing cyanosis or edema, Homan's sign negative  Neurologic: grossly nonfocal Psychologic: normal affect and mood.  ECG (independently read by me): Sinus bradycardia 53 bpm with PAC.  New significant T-wave inversion in leads 3 and aVF which was not present on his  April 2016 and prior 2015 ECGs.  April 2016 ECG (independently read by me): Sinus bradycardia 50 bpm.  Poor progression V1 through V3.  No significant ST segment changes  May 2015 ECG (independently read by me): Sinus bradycardia 53 beats per minute.  PR interval 176 ms, QTc interval normal at 470 ms.  No significant ST-T changes.  Prior December 2014 ECG: Sinus rhythm at 53 beats per minute. No ectopy. Normal intervals.  LABS:  BMP Latest Ref Rng 01/11/2015  Glucose 70 - 99 mg/dL 87  BUN 6 - 23 mg/dL 17  Creatinine 0.50 - 1.35 mg/dL 1.23  Sodium 135 - 145 mEq/L 141  Potassium 3.5 - 5.3 mEq/L 5.2  Chloride 96 - 112 mEq/L 103  CO2 19 - 32 mEq/L 29  Calcium 8.4 - 10.5 mg/dL 9.6   Hepatic Function Latest Ref Rng 01/11/2015  Total Protein 6.0 - 8.3 g/dL 7.1  Albumin 3.5 - 5.2 g/dL 4.3  AST 0 - 37 U/L 20  ALT 0 - 53 U/L 14  Alk Phosphatase 39 - 117 U/L 38(L)  Total Bilirubin 0.2 - 1.2 mg/dL 0.8   CBC Latest Ref Rng 01/11/2015  WBC 4.0 - 10.5 K/uL  7.9  Hemoglobin 13.0 - 17.0 g/dL 14.6  Hematocrit 39.0 - 52.0 % 42.4  Platelets 150 - 400 K/uL 187   Lab Results  Component Value Date   MCV 94.6 01/11/2015   Lab Results  Component Value Date   TSH 4.347 01/11/2015   No results found for: HGBA1C   Lipid Panel     Component Value Date/Time   CHOL 137 01/11/2015 1127   TRIG 180* 01/11/2015 1127   HDL 37* 01/11/2015 1127   CHOLHDL 3.7 01/11/2015 1127   VLDL 36 01/11/2015 1127   LDLCALC 64 01/11/2015 1127   RADIOLOGY: No results found.    ASSESSMENT AND PLAN: Douglas Bray is an active 81 year old gentleman who is 17 years status post CABG revascularization surgery. His last nuclear perfusion study in April 2016 continued to show fairly normal perfusion without ischemia.  On his echo in August 2014 he had very mild aortic stenosis with  normal systolic function.  His most recent echo.  Continue to show mild LVH with normal systolic function with grade 2 diastolic  dysfunction.  There was only very mild aortic stenosis, which had not significantly changed.  Douglas Bray denies any recent development of chest pain.  He has been working long hours following [place].  His ECG today is clearly different with development of new, somewhat deep T-wave inversion in leads 3 and aVF which have not been present on his ECG in April 2016 or previously.  Since he is 17 years following bypass surgery, I have recommended definitive cardiac catheterization to make certain he is not developed significant graft disease or native CAD progression.  With the Thanksgiving holiday in 2 days, I will start him on Plavix 75 mg daily but he will take 150 mg today and I plan to perform a definitive right.  Left heart catheterization this Friday the day after Thanksgiving for further evaluation.  Laboratory will be obtained in the fasting state.  In the interim, I will empirically increase his isosorbide to 90 mg daily.  Catheterization will be performed on 09/03/2015.  Time spent: 30 minutes  Troy Sine, MD, [facility]  08/31/2015 6:06 PM

## 2015-08-31 NOTE — Patient Instructions (Addendum)
Your physician has requested that you have a right and left cardiac catheterization to be done this Friday November 25th by Dr. Claiborne Billings. Cardiac catheterization is used to diagnose and/or treat various heart conditions. Doctors may recommend this procedure for a number of different reasons. The most common reason is to evaluate chest pain. Chest pain can be a symptom of coronary artery disease (CAD), and cardiac catheterization can show whether plaque is narrowing or blocking your heart's arteries. This procedure is also used to evaluate the valves, as well as measure the blood flow and oxygen levels in different parts of your heart. For further information please visit [email or web address].   Following your catheterization, you will not be allowed to drive for 3 days.  No lifting, pushing, or pulling greater that 10 pounds is allowed for 1 week.  You will be required to have the following tests prior to the procedure:  1. Blood work-the blood work can be done no more than 7 days prior to the procedure.  It can be done at any [facility] lab.  There is one downstairs on the first floor of this building and one in the [facility] building [id number]).  2. Chest Xray-the chest xray order has already been placed at the [place].      Your physician has recommended you make the following change in your medication: start new prescription given today for clopidogrel. Take as directed on the bottle. The isosorbide has been increased to 90 mg daily ( 1& 1/2 tablets daily.)

## 2015-09-01 ENCOUNTER — Ambulatory Visit
Admission: RE | Admit: 2015-09-01 | Discharge: 2015-09-01 | Disposition: A | Discharge status: Home / Self Care | Payer: Medicare Other | Source: Ambulatory Visit | Attending: Cardiovascular Disease | Admitting: Cardiovascular Disease

## 2015-09-01 DIAGNOSIS — Z01818 Encounter for other preprocedural examination: Secondary | ICD-10-CM

## 2015-09-01 LAB — CBC
HCT: 38.5 % — ABNORMAL LOW (ref 39.0–52.0)
Hemoglobin: 13.2 g/dL (ref 13.0–17.0)
MCH: 32.4 pg (ref 26.0–34.0)
MCHC: 34.3 g/dL (ref 30.0–36.0)
MCV: 94.4 fL (ref 78.0–100.0)
MPV: 9.8 fL (ref 8.6–12.4)
PLATELETS: 204 10*3/uL (ref 150–400)
RBC: 4.08 MIL/uL — AB (ref 4.22–5.81)
RDW: 13.9 % (ref 11.5–15.5)
WBC: 7.9 10*3/uL (ref 4.0–10.5)

## 2015-09-01 LAB — BASIC METABOLIC PANEL
BUN: 21 mg/dL (ref 7–25)
CALCIUM: 9.4 mg/dL (ref 8.6–10.3)
CO2: 26 mmol/L (ref 20–31)
CREATININE: 1.32 mg/dL — AB (ref 0.70–1.11)
Chloride: 104 mmol/L (ref 98–110)
GLUCOSE: 78 mg/dL (ref 65–99)
Potassium: 4.9 mmol/L (ref 3.5–5.3)
Sodium: 141 mmol/L (ref 135–146)

## 2015-09-01 LAB — PROTIME-INR
INR: 1.07 (ref <1.50)
PROTHROMBIN TIME: 14 s (ref 11.6–15.2)

## 2015-09-01 LAB — APTT: aPTT: 32 seconds (ref 24–37)

## 2015-09-01 LAB — TSH: TSH: 5.202 u[IU]/mL — ABNORMAL HIGH (ref 0.350–4.500)

## 2015-09-03 ENCOUNTER — Encounter (HOSPITAL_COMMUNITY)
Admission: RE | Disposition: A | Discharge status: Home / Self Care | Payer: Self-pay | Source: Ambulatory Visit | Attending: Cardiovascular Disease

## 2015-09-03 ENCOUNTER — Ambulatory Visit (HOSPITAL_COMMUNITY)
Admission: RE | Admit: 2015-09-03 | Discharge: 2015-09-03 | Disposition: A | Discharge status: Home / Self Care | Payer: Medicare Other | Source: Ambulatory Visit | Attending: Cardiovascular Disease | Admitting: Cardiovascular Disease

## 2015-09-03 DIAGNOSIS — I25119 Atherosclerotic heart disease of native coronary artery with unspecified angina pectoris: Secondary | ICD-10-CM | POA: Insufficient documentation

## 2015-09-03 DIAGNOSIS — I359 Nonrheumatic aortic valve disorder, unspecified: Secondary | ICD-10-CM | POA: Insufficient documentation

## 2015-09-03 DIAGNOSIS — I25708 Atherosclerosis of coronary artery bypass graft(s), unspecified, with other forms of angina pectoris: Secondary | ICD-10-CM

## 2015-09-03 DIAGNOSIS — I2510 Atherosclerotic heart disease of native coronary artery without angina pectoris: Secondary | ICD-10-CM | POA: Diagnosis not present

## 2015-09-03 DIAGNOSIS — Z7902 Long term (current) use of antithrombotics/antiplatelets: Secondary | ICD-10-CM | POA: Diagnosis not present

## 2015-09-03 DIAGNOSIS — I2582 Chronic total occlusion of coronary artery: Secondary | ICD-10-CM | POA: Diagnosis not present

## 2015-09-03 DIAGNOSIS — I272 Other secondary pulmonary hypertension: Secondary | ICD-10-CM | POA: Diagnosis not present

## 2015-09-03 DIAGNOSIS — I083 Combined rheumatic disorders of mitral, aortic and tricuspid valves: Secondary | ICD-10-CM | POA: Insufficient documentation

## 2015-09-03 DIAGNOSIS — Z951 Presence of aortocoronary bypass graft: Secondary | ICD-10-CM | POA: Insufficient documentation

## 2015-09-03 DIAGNOSIS — Z87891 Personal history of nicotine dependence: Secondary | ICD-10-CM | POA: Insufficient documentation

## 2015-09-03 DIAGNOSIS — Z01818 Encounter for other preprocedural examination: Secondary | ICD-10-CM

## 2015-09-03 DIAGNOSIS — Z7982 Long term (current) use of aspirin: Secondary | ICD-10-CM | POA: Insufficient documentation

## 2015-09-03 DIAGNOSIS — I25810 Atherosclerosis of coronary artery bypass graft(s) without angina pectoris: Secondary | ICD-10-CM | POA: Insufficient documentation

## 2015-09-03 HISTORY — PX: CARDIAC CATHETERIZATION: SHX172

## 2015-09-03 LAB — POCT I-STAT 3, ART BLOOD GAS (G3+)
ACID-BASE DEFICIT: 2 mmol/L (ref 0.0–2.0)
Bicarbonate: 23.6 mEq/L (ref 20.0–24.0)
O2 SAT: 97 %
PO2 ART: 96 mmHg (ref 80.0–100.0)
TCO2: 25 mmol/L (ref 0–100)
pCO2 arterial: 41.5 mmHg (ref 35.0–45.0)
pH, Arterial: 7.363 (ref 7.350–7.450)

## 2015-09-03 LAB — POCT I-STAT 3, VENOUS BLOOD GAS (G3P V)
ACID-BASE DEFICIT: 1 mmol/L (ref 0.0–2.0)
BICARBONATE: 25.3 meq/L — AB (ref 20.0–24.0)
O2 SAT: 58 %
TCO2: 27 mmol/L (ref 0–100)
pCO2, Ven: 47.5 mmHg (ref 45.0–50.0)
pH, Ven: 7.335 — ABNORMAL HIGH (ref 7.250–7.300)
pO2, Ven: 33 mmHg (ref 30.0–45.0)

## 2015-09-03 SURGERY — RIGHT/LEFT HEART CATH AND CORONARY/GRAFT ANGIOGRAPHY
Anesthesia: LOCAL

## 2015-09-03 MED ORDER — FENTANYL CITRATE (PF) 100 MCG/2ML IJ SOLN
INTRAMUSCULAR | Status: AC
  Filled 2015-09-03: qty 2

## 2015-09-03 MED ORDER — ASPIRIN 81 MG PO CHEW: 81.0000 mg | CHEWABLE_TABLET | Freq: Every day | ORAL | Status: DC

## 2015-09-03 MED ORDER — DIAZEPAM 5 MG PO TABS: 5.0000 mg | ORAL_TABLET | Freq: Four times a day (QID) | ORAL | Status: DC | PRN

## 2015-09-03 MED ORDER — SODIUM CHLORIDE 0.9 % IJ SOLN: 3.0000 mL | INTRAMUSCULAR | Status: DC | PRN

## 2015-09-03 MED ORDER — MIDAZOLAM HCL 2 MG/2ML IJ SOLN
INTRAMUSCULAR | Status: DC | PRN
  Administered 2015-09-03: 1 mg via INTRAVENOUS

## 2015-09-03 MED ORDER — CLOPIDOGREL BISULFATE 75 MG PO TABS: 75.0000 mg | ORAL_TABLET | Freq: Every day | ORAL | Status: DC

## 2015-09-03 MED ORDER — ASPIRIN 81 MG PO CHEW
81.0000 mg | CHEWABLE_TABLET | ORAL | Status: AC
  Administered 2015-09-03: 81 mg via ORAL

## 2015-09-03 MED ORDER — SODIUM CHLORIDE 0.9 % IV SOLN: 250.0000 mL | INTRAVENOUS | Status: DC | PRN

## 2015-09-03 MED ORDER — DIAZEPAM 5 MG PO TABS
5.0000 mg | ORAL_TABLET | ORAL | Status: AC
  Administered 2015-09-03: 5 mg via ORAL

## 2015-09-03 MED ORDER — SODIUM CHLORIDE 0.9 % IJ SOLN: 3.0000 mL | Freq: Two times a day (BID) | INTRAMUSCULAR | Status: DC

## 2015-09-03 MED ORDER — ONDANSETRON HCL 4 MG/2ML IJ SOLN: 4.0000 mg | Freq: Four times a day (QID) | INTRAMUSCULAR | Status: DC | PRN

## 2015-09-03 MED ORDER — LIDOCAINE HCL (PF) 1 % IJ SOLN
INTRAMUSCULAR | Status: AC
  Filled 2015-09-03: qty 30

## 2015-09-03 MED ORDER — ACETAMINOPHEN 325 MG PO TABS: 650.0000 mg | ORAL_TABLET | ORAL | Status: DC | PRN

## 2015-09-03 MED ORDER — FENTANYL CITRATE (PF) 100 MCG/2ML IJ SOLN
INTRAMUSCULAR | Status: DC | PRN
  Administered 2015-09-03: 25 ug via INTRAVENOUS

## 2015-09-03 MED ORDER — SODIUM CHLORIDE 0.9 % IV SOLN
INTRAVENOUS | Status: DC
  Administered 2015-09-03: 07:00:00 via INTRAVENOUS

## 2015-09-03 MED ORDER — HEPARIN (PORCINE) IN NACL 2-0.9 UNIT/ML-% IJ SOLN
INTRAMUSCULAR | Status: AC
  Filled 2015-09-03: qty 1000

## 2015-09-03 MED ORDER — DIAZEPAM 5 MG PO TABS
ORAL_TABLET | ORAL | Status: AC
  Administered 2015-09-03: 5 mg via ORAL
  Filled 2015-09-03: qty 1

## 2015-09-03 MED ORDER — ASPIRIN 81 MG PO CHEW
CHEWABLE_TABLET | ORAL | Status: AC
  Administered 2015-09-03: 81 mg via ORAL
  Filled 2015-09-03: qty 1

## 2015-09-03 MED ORDER — MIDAZOLAM HCL 2 MG/2ML IJ SOLN
INTRAMUSCULAR | Status: AC
  Filled 2015-09-03: qty 2

## 2015-09-03 MED ORDER — SODIUM CHLORIDE 0.9 % IV SOLN: INTRAVENOUS | Status: AC

## 2015-09-03 SURGICAL SUPPLY — 15 items
CATH EXPO 5F MPA-1 (CATHETERS) ×1 IMPLANT
CATH INFINITI 5 FR IM (CATHETERS) ×1 IMPLANT
CATH INFINITI 5FR MULTPACK ANG (CATHETERS) ×1 IMPLANT
CATH SITESEER 5F NTR (CATHETERS) ×1 IMPLANT
CATH SWAN GANZ 7F STRAIGHT (CATHETERS) ×1 IMPLANT
GUIDEWIRE ANGLED .035X260CM (WIRE) ×1 IMPLANT
KIT HEART LEFT (KITS) ×2 IMPLANT
KIT HEART RIGHT NAMIC (KITS) ×2 IMPLANT
PACK CARDIAC CATHETERIZATION (CUSTOM PROCEDURE TRAY) ×2 IMPLANT
SHEATH PINNACLE 5F 10CM (SHEATH) ×1 IMPLANT
SHEATH PINNACLE 7F 10CM (SHEATH) ×1 IMPLANT
SYR MEDRAD MARK V 150ML (SYRINGE) ×2 IMPLANT
TRANSDUCER W/STOPCOCK (MISCELLANEOUS) ×4 IMPLANT
WIRE EMERALD 3MM-J .035X150CM (WIRE) ×1 IMPLANT
WIRE EMERALD ST .035X150CM (WIRE) IMPLANT

## 2015-09-03 NOTE — Discharge Instructions (Signed)
Angiogram, Care After Refer to this sheet in the next few weeks. These instructions provide you with information about caring for yourself after your procedure. Your health care provider may also give you more specific instructions. Your treatment has been planned according to current medical practices, but problems sometimes occur. Call your health care provider if you have any problems or questions after your procedure. WHAT TO EXPECT AFTER THE PROCEDURE After your procedure, it is typical to have the following:  Bruising at the catheter insertion site that usually fades within 1-2 weeks.  Blood collecting in the tissue (hematoma) that may be painful to the touch. It should usually decrease in size and tenderness within 1-2 weeks. HOME CARE INSTRUCTIONS  Take medicines only as directed by your health care provider.  You may shower 24-48 hours after the procedure or as directed by your health care provider. Remove the bandage (dressing) and gently wash the site with plain soap and water. Pat the area dry with a clean towel. Do not rub the site, because this may cause bleeding.  Do not take baths, swim, or use a hot tub until your health care provider approves.  Check your insertion site every day for redness, swelling, or drainage.  Do not apply powder or lotion to the site.  Do not lift over 10 lb (4.5 kg) for 5 days after your procedure or as directed by your health care provider.  Ask your health care provider when it is okay to:  Return to work or school.  Resume usual physical activities or sports.  Resume sexual activity.  Do not drive home if you are discharged the same day as the procedure. Have someone else drive you.  You may drive 24 hours after the procedure unless otherwise instructed by your health care provider.  Do not operate machinery or power tools for 24 hours after the procedure or as directed by your health care provider.  If your procedure was done as an  outpatient procedure, which means that you went home the same day as your procedure, a responsible adult should be with you for the first 24 hours after you arrive home.  Keep all follow-up visits as directed by your health care provider. This is important. SEEK MEDICAL CARE IF:  You have a fever.  You have chills.  You have increased bleeding from the catheter insertion site. Hold pressure on the site. SEEK IMMEDIATE MEDICAL CARE IF:  You have unusual pain at the catheter insertion site.  You have redness, warmth, or swelling at the catheter insertion site.  You have drainage (other than a small amount of blood on the dressing) from the catheter insertion site.  The catheter insertion site is bleeding, and the bleeding does not stop after 30 minutes of holding steady pressure on the site.  The area near or just beyond the catheter insertion site becomes pale, cool, tingly, or numb.   This information is not intended to replace advice given to you by your health care provider. Make sure you discuss any questions you have with your health care provider.   Document Released: 04/13/2005 Document Revised: 10/16/2014 Document Reviewed: 02/26/2013 Elsevier Interactive Patient Education 2016 [place], Care After Refer to this sheet in the next few weeks. These instructions provide you with information on caring for yourself after your procedure. Your health care provider may also give you more specific instructions. Your treatment has been planned according to current medical practices, but problems sometimes occur. Call your  health care provider if you have any problems or questions after your procedure. WHAT TO EXPECT AFTER THE PROCEDURE After your procedure, it is typical to have the following sensations:  Mild discomfort at the catheter insertion site. HOME CARE INSTRUCTIONS   Take all medicines exactly as directed.  Follow any prescribed diet.  Follow instructions  regarding both rest and physical activity.  Drink more fluids for the first several days after the procedure in order to help flush dye from your kidneys. SEEK MEDICAL CARE IF:  You develop a rash.  You have fever not controlled by medicine. SEEK IMMEDIATE MEDICAL CARE IF:  There is pain, drainage, bleeding, redness, swelling, warmth or a red streak at the site of the IV tube.  The extremity where your IV tube was placed becomes discolored, numb, or cool.  You have difficulty breathing or shortness of breath.  You develop chest pain.  You have excessive dizziness or fainting.   This information is not intended to replace advice given to you by your health care provider. Make sure you discuss any questions you have with your health care provider.   Document Released: 07/16/2013 Document Revised: 09/30/2013 Document Reviewed: 07/16/2013 Elsevier Interactive Patient Education [employer].

## 2015-09-03 NOTE — Progress Notes (Signed)
Site area:  Right groin a 5 french arterial sheath and a 7 french venous sheath was removed   Site Prior to Removal:  Level 0  Pressure Applied For 20 MINUTES    Minutes Beginning at 0930am  Manual:   Yes.    Patient Status During Pull:  stable  Post Pull Groin Site:  Level 0  Post Pull Instructions Given:  Yes.    Post Pull Pulses Present:  Yes.    Dressing Applied:  Yes.    Comments:  VS remain stable during sheath pull

## 2015-09-03 NOTE — Interval H&P Note (Signed)
Cath Lab Visit (complete for each Cath Lab visit)  Clinical Evaluation Leading to the Procedure:   ACS: No.  Non-ACS:    Anginal Classification: CCS II  Anti-ischemic medical therapy: Maximal Therapy (2 or more classes of medications)  Non-Invasive Test Results: No non-invasive testing performed  Prior CABG: Previous CABG      History and Physical Interval Note:  09/03/2015 7:44 AM  Douglas Bray  has presented today for surgery, with the diagnosis of CAD  The various methods of treatment have been discussed with the patient and family. After consideration of risks, benefits and other options for treatment, the patient has consented to  Procedure(s): Right/Left Heart Cath and Coronary/Graft Angiography (N/A) as a surgical intervention .  The patient's history has been reviewed, patient examined, no change in status, stable for surgery.  I have reviewed the patient's chart and labs.  Questions were answered to the patient's satisfaction.     Linwood Gullikson A

## 2015-09-03 NOTE — H&P (View-Only) (Signed)
Patient ID: Douglas Bray, male   DOB: August 19, 1929, 81 y.o.   MRN: [id number]     HPI: Douglas Bray is a 81 y.o. male who presents for an 7 month cardiology evaluation.  [name] has known CAD and in April 1999 underwent CABG revascularization surgery LIMA to the LAD, vein to the diagonal, vein to the marginal, and vein to the PDA as well as optional diagonal vessel. In April 2002 he underwent complex 2 vessel intervention to the diagonal vessel with stenting as well as stenting to the circumflex graft. In January 2012 echo Doppler study suggested normal systolic function with diastolic dysfunction. He had moderate to severe tricuspid regurgitation with mild pulmonary hypertension, mild aortic insufficiency, mild mitral regurgitation.  In May 2013 due to episodes of left arm discomfort a nuclear perfusion study was performed which continued to show normal perfusion.  An echo Doppler study on 05/14/2013  revealed mild LVH with normal systolic function. Diastolic parameters were normal. There was very mild aortic stenosis with mildly thickened trileaflet valve with mild AR. He had mild mitral regurgitation, moderately severe tricuspid regurgitation and mild pulmonary hypertension with a PA pressure 37 mm. His mean aortic gradient was 7 with a peak gradient of 16.  Additional problems include hypertension and hyperlipidemia.  Since I last saw him, he underwent a 2-D echo Doppler study in April 2016 which showed an ejection fraction of 55-60% with mild concentric LVH and grade 2 diastolic dysfunction.  His mean gradient across his aortic valve was 9 with a peak gradient of 17 and his aortic valve area range from 1.7-2.04 cm.  A nuclear perfusion study revealed normal perfusion.  [place], [place]  He has been working hard typically 12-14 hours a day.  He admits to increasing fatigue but denies chest pain or shortness of breath.  He is not resting well.  He is  unaware of palpitations.  He denies PND, orthopnea.  He presents for evaluation.  Allergies  Allergen Reactions  . Ramipril Cough    Current Outpatient Prescriptions  Medication Sig Dispense Refill  . aspirin EC 81 MG tablet Take 81 mg by mouth daily.    Marland Kitchen atenolol (TENORMIN) 25 MG tablet Take 0.5 tablets (12.5 mg total) by mouth daily. 45 tablet 2  . Fish Oil OIL Take 1 capsule by mouth 2 (two) times daily.    . isosorbide mononitrate (IMDUR) 60 MG 24 hr tablet Take 1 & 1/2 tablet daily. 45 tablet 6  . quinapril (ACCUPRIL) 5 MG tablet Take 1 tablet (5 mg total) by mouth daily. 90 tablet 3  . simvastatin (ZOCOR) 40 MG tablet Take 1 tablet (40 mg total) by mouth daily. 90 tablet 3  . clopidogrel (PLAVIX) 75 MG tablet TAKE 2 TABLETS TODAY THEN 1 TABLET DAILY THEREAFTER 32 tablet 6   No current facility-administered medications for this visit.    Social History   Social History  . Marital Status: Married    Spouse Name: N/A  . Number of Children: N/A  . Years of Education: N/A   Occupational History  . Not on file.   Social History Main Topics  . Smoking status: Former Research scientist (life sciences)  . Smokeless tobacco: Current User    Types: Chew     Comment: quit smoking 50 years ago. chews occas.  . Alcohol Use: No  . Drug Use: No  . Sexual Activity: Not on file   Other Topics Concern  . Not  on file   Social History Narrative   Social history is notable in that he is married has 2 children, one deceased, 5 grandchildren 25 great-grandchildren. There is no call use. He previously chewed tobacco. His wife did suffer a CVA.  No family history on file.  Both parents are deceased.  He has 4 living brothers and one deceased.  ROS General: Negative; No fevers, chills, or night sweats;  HEENT: Negative; No changes in vision or hearing, sinus congestion, difficulty swallowing Pulmonary: Negative; No cough, wheezing, shortness of breath, hemoptysis Cardiovascular: Negative; No chest pain,  presyncope, syncope, palpatations GI: Negative; No nausea, vomiting, diarrhea, or abdominal pain GU: Negative; No dysuria, hematuria, or difficulty voiding Musculoskeletal: Negative; no myalgias, joint pain, or weakness Hematologic/Oncology: Negative; no easy bruising, bleeding Endocrine: Negative; no heat/cold intolerance; no diabetes Neuro: Negative; no changes in balance, headaches Skin: Negative; No rashes or skin lesions Psychiatric: Negative; No behavioral problems, depression Sleep: Negative; No snoring, daytime sleepiness, hypersomnolence, bruxism, restless legs, hypnogognic hallucinations, no cataplexy Other comprehensive 14 point system review is negative.   PE BP 108/62 mmHg  Pulse 53  Ht _0  (1.676 m)  Wt 169 lb 4.8 oz (76.794 kg)  BMI 27.34 kg/m2   Wt Readings from Last 3 Encounters:  08/31/15 169 lb 4.8 oz (76.794 kg)  01/27/15 179 lb (81.194 kg)  01/11/15 179 lb (81.194 kg)   General: Alert, oriented, no distress.  Skin: normal turgor, no rashes HEENT: Normocephalic, atraumatic. Pupils round and reactive; sclera anicteric;no lid lag.  Nose without nasal septal hypertrophy Mouth/Parynx benign; Mallinpatti scale 2 Neck: No JVD, no carotid bruits with normal carotid upstroke Lungs: clear to ausculatation and percussion; no wheezing or rales Chest wall: Nontender to palpation Heart: RRR, s1 s2 normal 2/6 systolic murmur in aortic region concordant with his aortic stenosis. No S3 or S4 gallop; no diastolic murmur, no rubs, thrills or heaves. Abdomen: soft, nontender; no hepatosplenomehaly, BS+; abdominal aorta nontender and not dilated by palpation. Back: No CVA tenderness Pulses 2+ Extremities: no clubbing cyanosis or edema, Homan's sign negative  Neurologic: grossly nonfocal Psychologic: normal affect and mood.  ECG (independently read by me): Sinus bradycardia 53 bpm with PAC.  New significant T-wave inversion in leads 3 and aVF which was not present on his  April 2016 and prior 2015 ECGs.  April 2016 ECG (independently read by me): Sinus bradycardia 50 bpm.  Poor progression V1 through V3.  No significant ST segment changes  May 2015 ECG (independently read by me): Sinus bradycardia 53 beats per minute.  PR interval 176 ms, QTc interval normal at 470 ms.  No significant ST-T changes.  Prior December 2014 ECG: Sinus rhythm at 53 beats per minute. No ectopy. Normal intervals.  LABS:  BMP Latest Ref Rng 01/11/2015  Glucose 70 - 99 mg/dL 87  BUN 6 - 23 mg/dL 17  Creatinine 0.50 - 1.35 mg/dL 1.23  Sodium 135 - 145 mEq/L 141  Potassium 3.5 - 5.3 mEq/L 5.2  Chloride 96 - 112 mEq/L 103  CO2 19 - 32 mEq/L 29  Calcium 8.4 - 10.5 mg/dL 9.6   Hepatic Function Latest Ref Rng 01/11/2015  Total Protein 6.0 - 8.3 g/dL 7.1  Albumin 3.5 - 5.2 g/dL 4.3  AST 0 - 37 U/L 20  ALT 0 - 53 U/L 14  Alk Phosphatase 39 - 117 U/L 38(L)  Total Bilirubin 0.2 - 1.2 mg/dL 0.8   CBC Latest Ref Rng 01/11/2015  WBC 4.0 - 10.5 K/uL  7.9  Hemoglobin 13.0 - 17.0 g/dL 14.6  Hematocrit 39.0 - 52.0 % 42.4  Platelets 150 - 400 K/uL 187   Lab Results  Component Value Date   MCV 94.6 01/11/2015   Lab Results  Component Value Date   TSH 4.347 01/11/2015   No results found for: HGBA1C   Lipid Panel     Component Value Date/Time   CHOL 137 01/11/2015 1127   TRIG 180* 01/11/2015 1127   HDL 37* 01/11/2015 1127   CHOLHDL 3.7 01/11/2015 1127   VLDL 36 01/11/2015 1127   LDLCALC 64 01/11/2015 1127   RADIOLOGY: No results found.    ASSESSMENT AND PLAN: Mr. PRENTIS LANGDON is an active 81 year old gentleman who is 17 years status post CABG revascularization surgery. His last nuclear perfusion study in April 2016 continued to show fairly normal perfusion without ischemia.  On his echo in August 2014 he had very mild aortic stenosis with  normal systolic function.  His most recent echo.  Continue to show mild LVH with normal systolic function with grade 2 diastolic  dysfunction.  There was only very mild aortic stenosis, which had not significantly changed.  Mr. Perrow denies any recent development of chest pain.  He has been working long hours following his move from the country.  His ECG today is clearly different with development of new, somewhat deep T-wave inversion in leads 3 and aVF which have not been present on his ECG in April 2016 or previously.  Since he is 17 years following bypass surgery, I have recommended definitive cardiac catheterization to make certain he is not developed significant graft disease or native CAD progression.  With the Thanksgiving holiday in 2 days, I will start him on Plavix 75 mg daily but he will take 150 mg today and I plan to perform a definitive right.  Left heart catheterization this Friday the day after Thanksgiving for further evaluation.  Laboratory will be obtained in the fasting state.  In the interim, I will empirically increase his isosorbide to 90 mg daily.  Catheterization will be performed on 09/03/2015.  Time spent: 30 minutes  Troy Sine, MD, [facility]  08/31/2015 6:06 PM

## 2015-09-06 ENCOUNTER — Encounter (HOSPITAL_COMMUNITY): Payer: Self-pay | Admitting: Cardiovascular Disease

## 2015-09-06 MED FILL — Heparin Sodium (Porcine) 2 Unit/ML in Sodium Chloride 0.9%: INTRAMUSCULAR | Qty: 1000 | Status: AC

## 2015-09-06 MED FILL — Lidocaine HCl Local Preservative Free (PF) Inj 1%: INTRAMUSCULAR | Qty: 30 | Status: AC

## 2016-03-06 ENCOUNTER — Other Ambulatory Visit: Payer: Self-pay | Admitting: Cardiovascular Disease

## 2016-03-07 NOTE — Telephone Encounter (Signed)
Rx(s) sent to pharmacy electronically.  

## 2016-04-04 ENCOUNTER — Other Ambulatory Visit (HOSPITAL_COMMUNITY): Payer: Self-pay | Admitting: Cardiovascular Disease

## 2016-04-09 IMAGING — CR DG CHEST 2V
2 series · 2 of 2 positions shown · non-contrast
Comparison: None.

CLINICAL DATA: Preop for cardiac catheterization, abnormal EKG

EXAM:
CHEST  2 VIEW

[w chest pa]
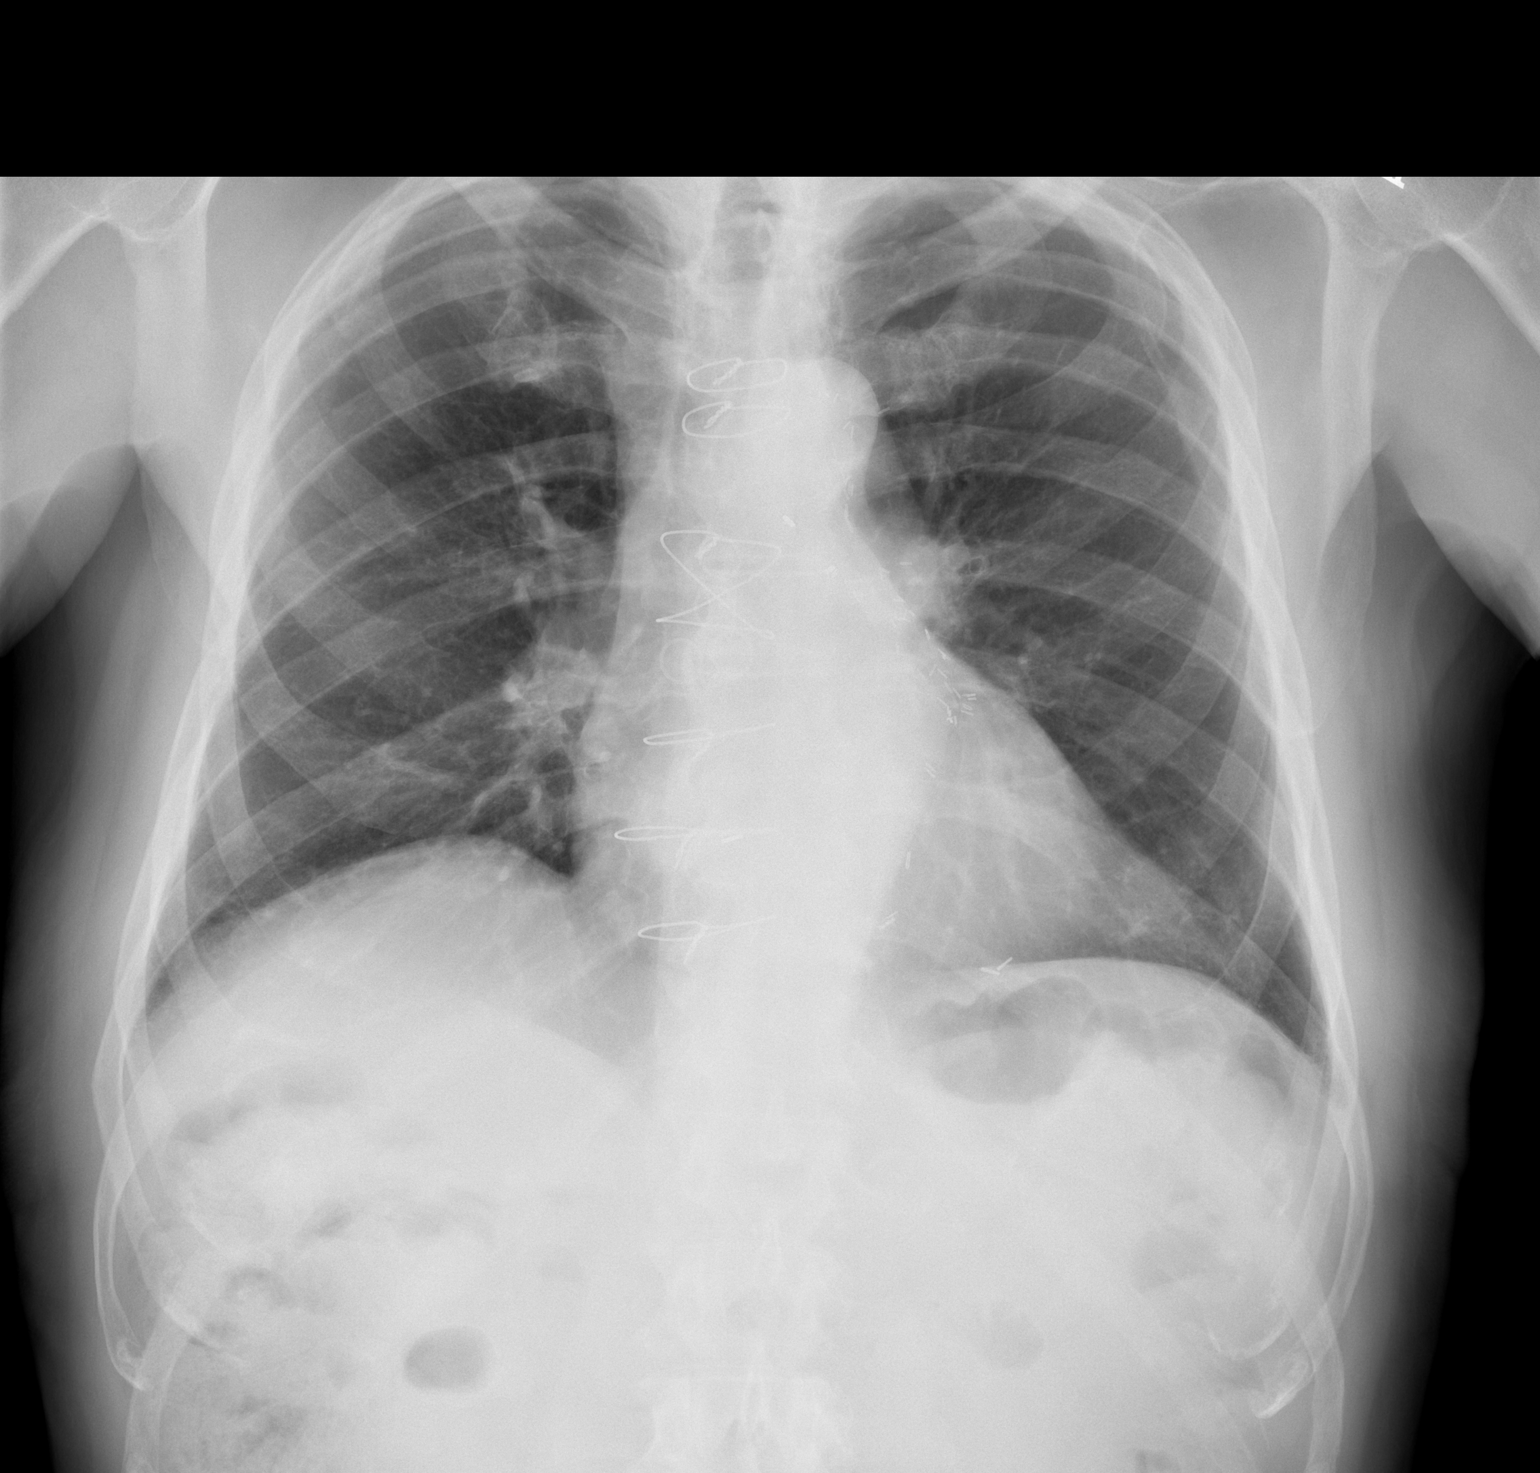

[w chest lat]
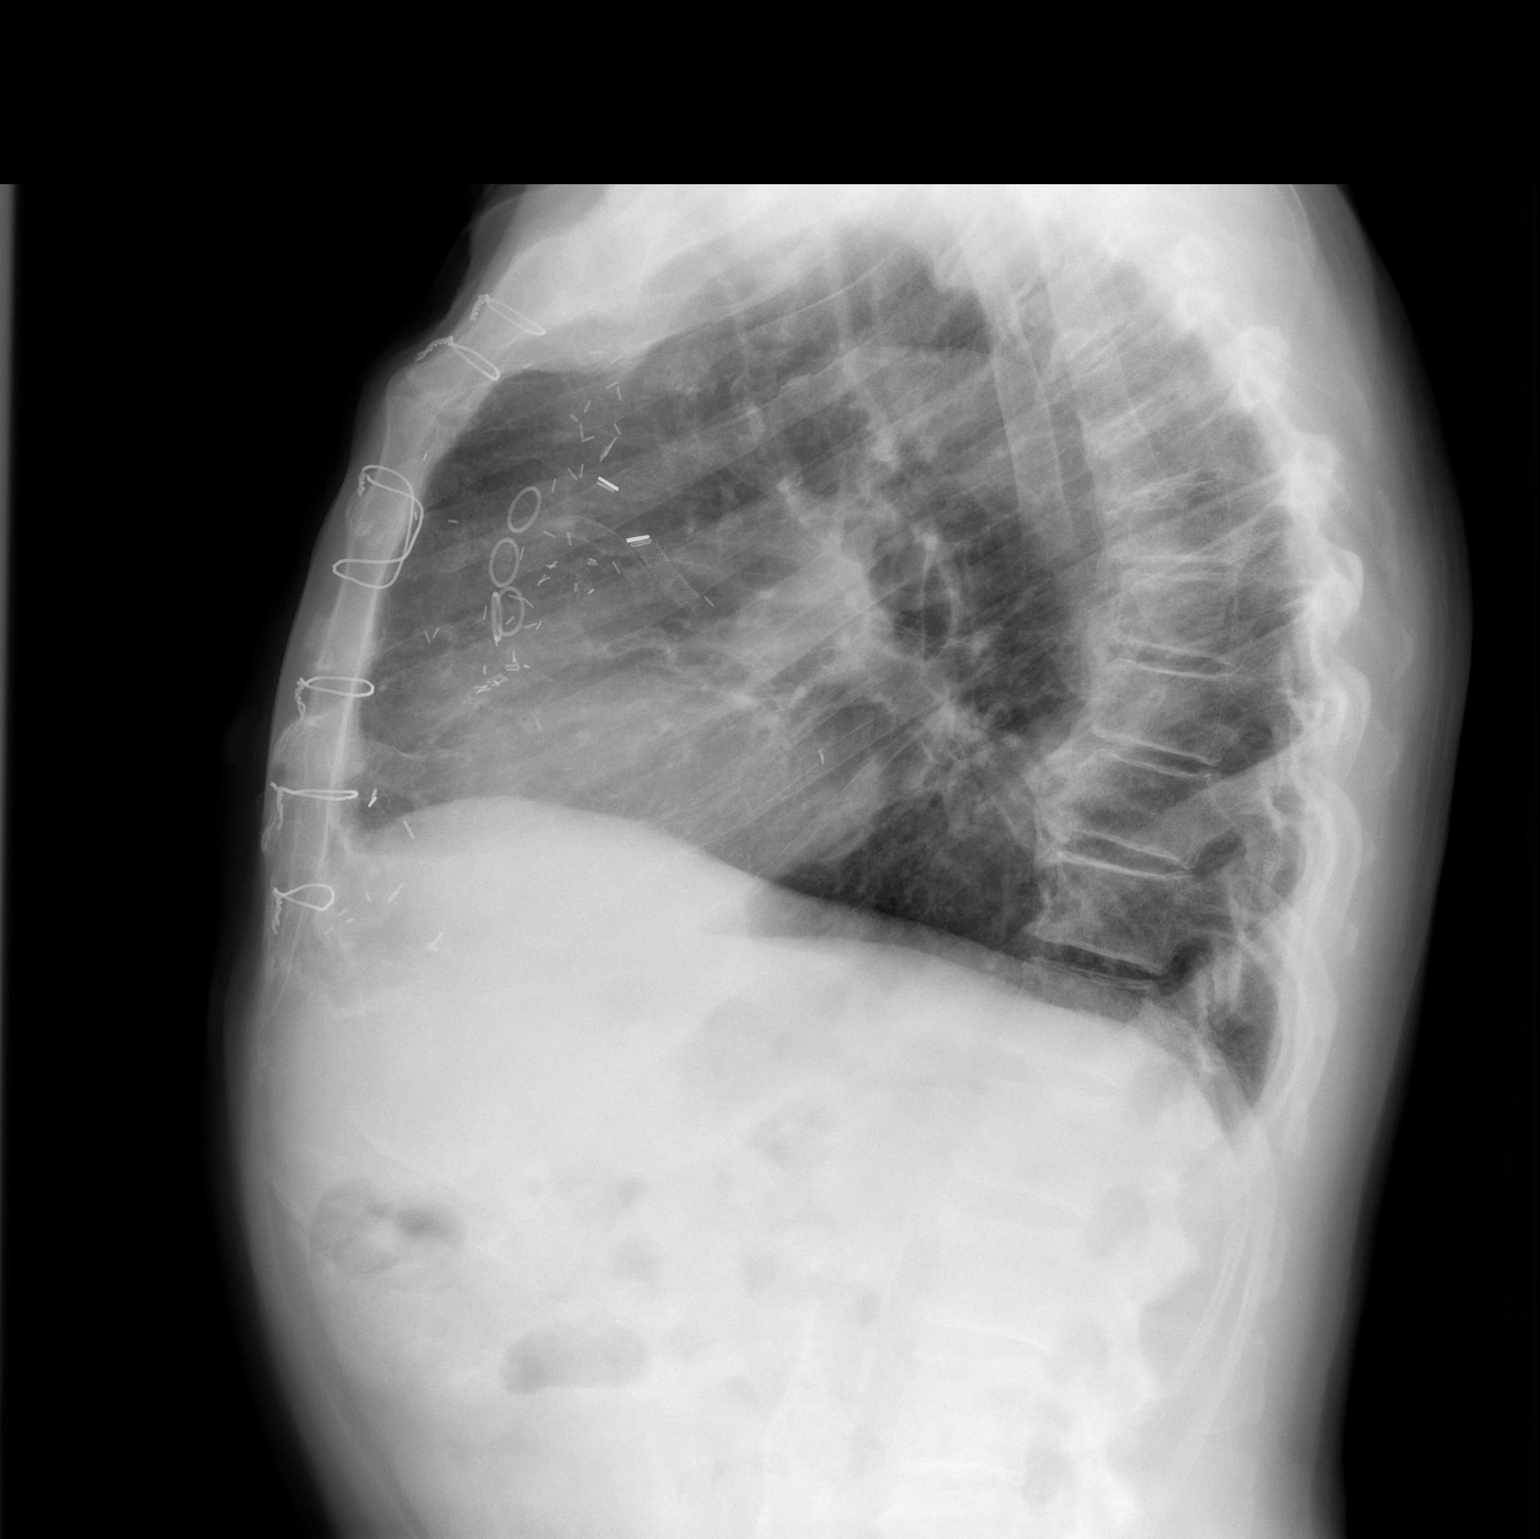

[2 of 2 positions shown; findings below may reference images not displayed]

FINDINGS: No active infiltrate or effusion is seen. The lungs are hyperaerated
with increased AP diameter consistent with and element of emphysema.
Mediastinal and hilar contours are unremarkable. There is moderate
cardiomegaly present. Median sternotomy sutures are noted from prior
CABG. There are degenerative changes in the lower thoracic spine.
IMPRESSION: No active lung disease.  Probable emphysema.

## 2016-05-15 ENCOUNTER — Encounter: Payer: Self-pay | Admitting: Cardiovascular Disease

## 2016-05-15 ENCOUNTER — Ambulatory Visit (INDEPENDENT_AMBULATORY_CARE_PROVIDER_SITE_OTHER): Payer: Medicare Other | Admitting: Cardiovascular Disease

## 2016-05-15 VITALS — BP 122/64 | HR 60 | Ht 68.5 in | Wt 171.0 lb

## 2016-05-15 DIAGNOSIS — E785 Hyperlipidemia, unspecified: Secondary | ICD-10-CM | POA: Diagnosis not present

## 2016-05-15 DIAGNOSIS — I10 Essential (primary) hypertension: Secondary | ICD-10-CM

## 2016-05-15 DIAGNOSIS — I2510 Atherosclerotic heart disease of native coronary artery without angina pectoris: Secondary | ICD-10-CM

## 2016-05-15 MED ORDER — CLOPIDOGREL BISULFATE 75 MG PO TABS: 75.0000 mg | ORAL_TABLET | Freq: Every day | ORAL | 6 refills | Status: DC

## 2016-05-15 NOTE — Patient Instructions (Addendum)
Your physician has recommended you make the following change in your medication:   1.) restart prescription for clopidogrel.    Your physician wants you to follow-up in: 6 months or sooner if needed. You will receive a reminder letter in the mail two months in advance. If you don't receive a letter, please call our office to schedule the follow-up appointment.   If you need a refill on your cardiac medications before your next appointment, please call your pharmacy.

## 2016-05-17 ENCOUNTER — Encounter: Payer: Self-pay | Admitting: Cardiovascular Disease

## 2016-05-17 NOTE — Progress Notes (Signed)
Patient ID: Douglas Bray, male   DOB: 04/12/1929, 82 y.o.   MRN: [id number]     HPI: Douglas Bray is a 82 y.o. male who presents for an 23 month cardiology evaluation.  Douglas Bray has known CAD and in April 1999 underwent CABG revascularization surgery LIMA to the LAD, vein to the diagonal, vein to the marginal, and vein to the PDA as well as optional diagonal vessel. In April 2002 Douglas Bray underwent complex 2 vessel intervention to the diagonal vessel with stenting as well as stenting to the circumflex graft. In January 2012 echo Doppler study suggested normal systolic function with diastolic dysfunction. Douglas Bray had moderate to severe tricuspid regurgitation with mild pulmonary hypertension, mild aortic insufficiency, mild mitral regurgitation.  In May 2013 due to episodes of left arm discomfort a nuclear perfusion study was performed which continued to show normal perfusion.  An echo Doppler study on 05/14/2013  revealed mild LVH with normal systolic function. Diastolic parameters were normal. There was very mild aortic stenosis with mildly thickened trileaflet valve with mild AR. Douglas Bray had mild mitral regurgitation, moderately severe tricuspid regurgitation and mild pulmonary hypertension with a PA pressure 37 mm. Douglas Bray mean aortic gradient was 7 with a peak gradient of 16.  Additional problems include hypertension and hyperlipidemia.  Since I last saw Douglas Bray, Douglas Bray underwent a 2-D echo Doppler study in April 2016 which showed an ejection fraction of 55-60% with mild concentric LVH and grade 2 diastolic dysfunction.  Douglas Bray mean gradient across Douglas Bray aortic valve was 9 with a peak gradient of 17 and Douglas Bray aortic valve area range from 1.7-2.04 cm.  A nuclear perfusion study revealed normal perfusion.   Douglas Bray has been working hard typically 12-14 hours a day.  Douglas Bray admits to increasing fatigue but denies chest pain or shortness of breath.  Douglas Bray is not resting well.  Douglas Bray is unaware of palpitations.  Douglas Bray denies PND, orthopnea.   When I last saw Douglas Bray, Douglas Bray had developed new deep T-wave inversion inferiorly, which had not been present April 2016 at the time of Douglas Bray stress test.  Plavix was added to Douglas Bray medical rate regimen and nitrates were increased.  On 08/29/2015.  Douglas Bray underwent right and left heart cardiac catheterization.  Douglas Bray normal LV function with an EF of 55%.  Douglas Bray right heart pressures were normal.  There was very minimal aortic valve stenosis.  Douglas Bray has severe native CAD with total occlusion of the very proximal LAD after a small septal perforating artery, total occlusion of the very proximal left circumflex vessel after small OM vessel and total occlusion of the proximal RCA.  Douglas Bray is a patent LIMA graft supplying the mid LAD.  There was a pain, vein graft supplying the distal circumflex marginal with the previously placed stent in the midportion of the graft with intimal hyperplasia.  Narrowing of 30-40% and 50% somewhat eccentric narrowing in the more distal portion of the graft.  Douglas Bray had septal collaterals to the proximal LAD from the distal OM branch.  Vein graft which probably had previously supplied the optional diagonal vessel.  There was a patent vein graft to the diagonal vessel, which with previously placed stent that is widely patent.  In the midportion of the graft.  The vein graft supplying the PDA and a dominant RCA system was patent.  Medical therapy was recommended.  Since I last saw Douglas Bray, [place].  Unfortunately, Douglas Bray wife passed away several months ago.  Douglas Bray had noticed  increasing bruisability.  Douglas Bray had stopped Douglas Bray Plavix for the past week.  Douglas Bray presents for reevaluation.  Allergies  Allergen Reactions  . Ramipril Cough    Current Outpatient Prescriptions  Medication Sig Dispense Refill  . aspirin EC 81 MG tablet Take 81 mg by mouth daily.    Marland Kitchen atenolol (TENORMIN) 25 MG tablet TAKE 1/2 TABLET DAILY 45 tablet 2  . Fish Oil OIL Take 1 capsule by mouth 2 (two) times daily.    . isosorbide  mononitrate (IMDUR) 60 MG 24 hr tablet Take 1 & 1/2 tablet daily. (Patient taking differently: Take 90 mg by mouth daily. ) 45 tablet 6  . naproxen sodium (ANAPROX) 220 MG tablet Take 220 mg by mouth 2 (two) times daily as needed (pain).    . quinapril (ACCUPRIL) 5 MG tablet Take 1 tablet (5 mg total) by mouth daily. 90 tablet 1  . simvastatin (ZOCOR) 40 MG tablet TAKE ONE (1) TABLET DAILY 90 tablet 2  . clopidogrel (PLAVIX) 75 MG tablet Take 1 tablet (75 mg total) by mouth daily. 30 tablet 6   No current facility-administered medications for this visit.     Social History   Social History  . Marital status: Widowed    Spouse name: N/A  . Number of children: N/A  . Years of education: N/A   Occupational History  . Not on file.   Social History Main Topics  . Smoking status: Former Research scientist (life sciences)  . Smokeless tobacco: Current User    Types: Chew     Comment: quit smoking 50 years ago. chews occas.  . Alcohol use No  . Drug use: No  . Sexual activity: Not on file   Other Topics Concern  . Not on file   Social History Narrative  . No narrative on file   Social history is notable in that Douglas Bray is married has 2 children, one deceased, 5 grandchildren 8 great-grandchildren. There is no call use. Douglas Bray previously chewed tobacco. Douglas Bray wife did suffer a CVA.  History reviewed. No pertinent family history.  Both parents are deceased.  Douglas Bray has 4 living brothers and one deceased.  ROS General: Negative; No fevers, chills, or night sweats;  HEENT: Negative; No changes in vision or hearing, sinus congestion, difficulty swallowing Pulmonary: Negative; No cough, wheezing, shortness of breath, hemoptysis Cardiovascular: Negative; No chest pain, presyncope, syncope, palpatations GI: Negative; No nausea, vomiting, diarrhea, or abdominal pain GU: Negative; No dysuria, hematuria, or difficulty voiding Musculoskeletal: Negative; no myalgias, joint pain, or weakness Hematologic/Oncology: Negative; no easy  bruising, bleeding Endocrine: Negative; no heat/cold intolerance; no diabetes Neuro: Negative; no changes in balance, headaches Skin: Negative; No rashes or skin lesions Psychiatric: Negative; No behavioral problems, depression Sleep: Negative; No snoring, daytime sleepiness, hypersomnolence, bruxism, restless legs, hypnogognic hallucinations, no cataplexy Other comprehensive 14 point system review is negative.   PE BP 122/64   Pulse 60   Ht 5' 8.5" (1.74 m)   Wt 171 lb (77.6 kg)   BMI 25.62 kg/m    Wt Readings from Last 3 Encounters:  05/15/16 171 lb (77.6 kg)  09/03/15 168 lb (76.2 kg)  08/31/15 169 lb 4.8 oz (76.8 kg)   General: Alert, oriented, no distress.  Skin: normal turgor, no rashes HEENT: Normocephalic, atraumatic. Pupils round and reactive; sclera anicteric;no lid lag.  Nose without nasal septal hypertrophy Mouth/Parynx benign; Mallinpatti scale 2 Neck: No JVD, no carotid bruits with normal carotid upstroke Lungs: clear to ausculatation and percussion; no wheezing or rales  Chest wall: Nontender to palpation Heart: RRR, s1 s2 normal 2/6 systolic murmur in aortic region concordant with Douglas Bray aortic stenosis. No S3 or S4 gallop; no diastolic murmur, no rubs, thrills or heaves. Abdomen: soft, nontender; no hepatosplenomehaly, BS+; abdominal aorta nontender and not dilated by palpation. Back: No CVA tenderness Pulses 2+ Extremities: no clubbing cyanosis or edema, Homan's sign negative  Neurologic: grossly nonfocal Psychologic: normal affect and mood.  ECG (independently read by me): Normal sinus rhythm at 60 bpm.  LVH by voltage criteria.  Previous deep T-wave inversion inferiorly.  Is no longer present.  ECG (independently read by me): Sinus bradycardia 53 bpm with PAC.  New significant T-wave inversion in leads 3 and aVF which was not present on Douglas Bray April 2016 and prior 2015 ECGs.  April 2016 ECG (independently read by me): Sinus bradycardia 50 bpm.  Poor progression  V1 through V3.  No significant ST segment changes  May 2015 ECG (independently read by me): Sinus bradycardia 53 beats per minute.  PR interval 176 ms, QTc interval normal at 470 ms.  No significant ST-T changes.  Prior December 2014 ECG: Sinus rhythm at 53 beats per minute. No ectopy. Normal intervals.  LABS:  BMP Latest Ref Rng & Units 08/31/2015 01/11/2015  Glucose 65 - 99 mg/dL 78 87  BUN 7 - 25 mg/dL 21 17  Creatinine 0.70 - 1.11 mg/dL 1.32(H) 1.23  Sodium 135 - 146 mmol/L 141 141  Potassium 3.5 - 5.3 mmol/L 4.9 5.2  Chloride 98 - 110 mmol/L 104 103  CO2 20 - 31 mmol/L 26 29  Calcium 8.6 - 10.3 mg/dL 9.4 9.6   Hepatic Function Latest Ref Rng & Units 01/11/2015  Total Protein 6.0 - 8.3 g/dL 7.1  Albumin 3.5 - 5.2 g/dL 4.3  AST 0 - 37 U/L 20  ALT 0 - 53 U/L 14  Alk Phosphatase 39 - 117 U/L 38(L)  Total Bilirubin 0.2 - 1.2 mg/dL 0.8   CBC Latest Ref Rng & Units 08/31/2015 01/11/2015  WBC 4.0 - 10.5 K/uL 7.9 7.9  Hemoglobin 13.0 - 17.0 g/dL 13.2 14.6  Hematocrit 39.0 - 52.0 % 38.5(L) 42.4  Platelets 150 - 400 K/uL 204 187   Lab Results  Component Value Date   MCV 94.4 08/31/2015   MCV 94.6 01/11/2015   Lab Results  Component Value Date   TSH 5.202 (H) 08/31/2015   No results found for: HGBA1C   Lipid Panel     Component Value Date/Time   CHOL 137 01/11/2015 1127   TRIG 180 (H) 01/11/2015 1127   HDL 37 (L) 01/11/2015 1127   CHOLHDL 3.7 01/11/2015 1127   VLDL 36 01/11/2015 1127   LDLCALC 64 01/11/2015 1127   RADIOLOGY: No results found.    ASSESSMENT AND PLAN: Douglas Bray is an active 82 year old gentleman who is status post CABG revascularization surgery In 1999.  Douglas Bray last nuclear perfusion study in April 2016 continued to show fairly normal perfusion without ischemia.  On Douglas Bray echo in August 2014 Douglas Bray had very mild aortic stenosis with  normal systolic function.  When I last saw me had developed deep T-wave inversion inferiorly.  Douglas Bray ultimately underwent  right and left heart cardiac catheterization with findings as noted above.  I again thoroughly reviewed these findings with Douglas Bray in detail.  Medical therapy was recommended.  Douglas Bray has done well and denies any significant chest pain symptomatology.  Douglas Bray had noted some bruisability leading to self discontinuance of Plavix.  I've  suggested that Douglas Bray at least try this every other day.  Douglas Bray blood pressure today is controlled on Douglas Bray current regimen consisting of atenolol 25 mg daily, Accupril 5 mg and isosorbide 60 mg.  Douglas Bray continues to be on simvastatin 40 mg for hyperlipidemia.  As long as Douglas Bray remains stable I will see Douglas Bray in 6 months for reevaluation or sooner if problems.    Time spent: 25 minutes  Troy Sine, MD, [facility]  05/17/2016 9:47 PM

## 2016-05-22 DIAGNOSIS — I10 Essential (primary) hypertension: Secondary | ICD-10-CM | POA: Diagnosis not present

## 2016-05-22 DIAGNOSIS — Z0000 Encounter for general adult medical examination without abnormal findings: Secondary | ICD-10-CM | POA: Diagnosis not present

## 2016-05-22 DIAGNOSIS — I2510 Atherosclerotic heart disease of native coronary artery without angina pectoris: Secondary | ICD-10-CM | POA: Diagnosis not present

## 2016-05-22 DIAGNOSIS — Z1389 Encounter for screening for other disorder: Secondary | ICD-10-CM | POA: Diagnosis not present

## 2016-05-22 DIAGNOSIS — E785 Hyperlipidemia, unspecified: Secondary | ICD-10-CM | POA: Diagnosis not present

## 2016-06-06 ENCOUNTER — Other Ambulatory Visit: Payer: Self-pay | Admitting: Cardiovascular Disease

## 2016-06-06 NOTE — Telephone Encounter (Signed)
Rx request sent to pharmacy.  

## 2016-07-24 DIAGNOSIS — Z23 Encounter for immunization: Secondary | ICD-10-CM | POA: Diagnosis not present

## 2016-08-23 DIAGNOSIS — E785 Hyperlipidemia, unspecified: Secondary | ICD-10-CM | POA: Diagnosis not present

## 2016-08-23 DIAGNOSIS — I10 Essential (primary) hypertension: Secondary | ICD-10-CM | POA: Diagnosis not present

## 2016-08-23 DIAGNOSIS — I25810 Atherosclerosis of coronary artery bypass graft(s) without angina pectoris: Secondary | ICD-10-CM | POA: Diagnosis not present

## 2016-09-19 DIAGNOSIS — L270 Generalized skin eruption due to drugs and medicaments taken internally: Secondary | ICD-10-CM | POA: Diagnosis not present

## 2016-09-19 DIAGNOSIS — C44222 Squamous cell carcinoma of skin of right ear and external auricular canal: Secondary | ICD-10-CM | POA: Diagnosis not present

## 2016-09-19 DIAGNOSIS — L578 Other skin changes due to chronic exposure to nonionizing radiation: Secondary | ICD-10-CM | POA: Diagnosis not present

## 2016-09-26 DIAGNOSIS — L821 Other seborrheic keratosis: Secondary | ICD-10-CM | POA: Diagnosis not present

## 2016-09-26 DIAGNOSIS — L570 Actinic keratosis: Secondary | ICD-10-CM | POA: Diagnosis not present

## 2016-09-26 DIAGNOSIS — L300 Nummular dermatitis: Secondary | ICD-10-CM | POA: Diagnosis not present

## 2016-09-26 DIAGNOSIS — C44222 Squamous cell carcinoma of skin of right ear and external auricular canal: Secondary | ICD-10-CM | POA: Diagnosis not present

## 2016-09-26 DIAGNOSIS — L270 Generalized skin eruption due to drugs and medicaments taken internally: Secondary | ICD-10-CM | POA: Diagnosis not present

## 2016-09-28 ENCOUNTER — Other Ambulatory Visit: Payer: Self-pay | Admitting: Cardiovascular Disease

## 2016-10-13 ENCOUNTER — Other Ambulatory Visit (HOSPITAL_COMMUNITY): Payer: Self-pay | Admitting: Cardiovascular Disease

## 2016-10-13 NOTE — Telephone Encounter (Signed)
Rx(s) sent to pharmacy electronically.  

## 2016-10-17 DIAGNOSIS — J329 Chronic sinusitis, unspecified: Secondary | ICD-10-CM | POA: Diagnosis not present

## 2016-11-27 ENCOUNTER — Other Ambulatory Visit: Payer: Self-pay | Admitting: Cardiovascular Disease

## 2016-12-11 ENCOUNTER — Other Ambulatory Visit: Payer: Self-pay | Admitting: Cardiovascular Disease

## 2016-12-13 ENCOUNTER — Encounter: Payer: Self-pay | Admitting: Cardiovascular Disease

## 2016-12-13 ENCOUNTER — Ambulatory Visit (INDEPENDENT_AMBULATORY_CARE_PROVIDER_SITE_OTHER): Payer: Medicare Other | Admitting: Cardiovascular Disease

## 2016-12-13 ENCOUNTER — Telehealth: Payer: Self-pay | Admitting: *Deleted

## 2016-12-13 VITALS — BP 118/66 | HR 57 | Ht 68.0 in | Wt 174.0 lb

## 2016-12-13 DIAGNOSIS — Z79899 Other long term (current) drug therapy: Secondary | ICD-10-CM

## 2016-12-13 DIAGNOSIS — I2583 Coronary atherosclerosis due to lipid rich plaque: Secondary | ICD-10-CM

## 2016-12-13 DIAGNOSIS — I2510 Atherosclerotic heart disease of native coronary artery without angina pectoris: Secondary | ICD-10-CM

## 2016-12-13 DIAGNOSIS — E785 Hyperlipidemia, unspecified: Secondary | ICD-10-CM

## 2016-12-13 DIAGNOSIS — Z951 Presence of aortocoronary bypass graft: Secondary | ICD-10-CM | POA: Diagnosis not present

## 2016-12-13 DIAGNOSIS — I350 Nonrheumatic aortic (valve) stenosis: Secondary | ICD-10-CM

## 2016-12-13 DIAGNOSIS — I10 Essential (primary) hypertension: Secondary | ICD-10-CM | POA: Diagnosis not present

## 2016-12-13 MED ORDER — QUINAPRIL HCL 5 MG PO TABS: ORAL_TABLET | ORAL | 2 refills | Status: DC

## 2016-12-13 MED ORDER — SIMVASTATIN 40 MG PO TABS: ORAL_TABLET | ORAL | 3 refills | Status: DC

## 2016-12-13 MED ORDER — CLOPIDOGREL BISULFATE 75 MG PO TABS: 75.0000 mg | ORAL_TABLET | Freq: Every day | ORAL | 3 refills | Status: DC

## 2016-12-13 MED ORDER — ISOSORBIDE MONONITRATE ER 60 MG PO TB24: ORAL_TABLET | ORAL | 6 refills | Status: DC

## 2016-12-13 NOTE — Patient Instructions (Addendum)
Medication Instructions:   NO CHANGES  Labwork:  LAB SLIPS PROVIDED.  Testing/Procedures:   NONE  Follow-Up:  Your physician wants you to follow-up in: 6 MONTHS OR SOONER IF NEEDED. You will receive a reminder letter in the mail two months in advance. If you don't receive a letter, please call our office to schedule the follow-up appointment.   Any Other Special Instructions Will Be Listed Below (If Applicable).  Your physician has requested that you have an echocardiogram. Echocardiography is a painless test that uses sound waves to create images of your heart. It provides your doctor with information about the size and shape of your heart and how well your heart's chambers and valves are working. This procedure takes approximately one hour. There are no restrictions for this procedure. ( DR KELLY ORDERED AFTER PATIENT HAD LEFT)

## 2016-12-13 NOTE — Telephone Encounter (Signed)
Attempted to call patient to notify him that after he left Dr Claiborne Billings decided that he wanted him to have a echo. I will attempt to contact him again.

## 2016-12-14 ENCOUNTER — Encounter: Payer: Self-pay | Admitting: Cardiovascular Disease

## 2016-12-14 NOTE — Progress Notes (Signed)
Patient ID: Douglas Bray, male   DOB: 1928/10/30, 83 y.o.   MRN: [id number]     HPI: Douglas Bray is a 83 y.o. male who presents for an 7 month cardiology evaluation.  Douglas Bray has known CAD and in April 1999 underwent CABG revascularization surgery LIMA to the LAD, vein to the diagonal, vein to the marginal, and vein to the PDA as well as optional diagonal vessel. In April 2002 he underwent complex 2 vessel intervention to the diagonal vessel with stenting as well as stenting to the circumflex graft. In January 2012 echo Doppler study suggested normal systolic function with diastolic dysfunction. He had moderate to severe tricuspid regurgitation with mild pulmonary hypertension, mild aortic insufficiency, mild mitral regurgitation.  In May 2013 due to episodes of left arm discomfort a nuclear perfusion study was performed which continued to show normal perfusion.  An echo Doppler study on 05/14/2013  revealed mild LVH with normal systolic function. Diastolic parameters were normal. There was very mild aortic stenosis with mildly thickened trileaflet valve with mild AR. He had mild mitral regurgitation, moderately severe tricuspid regurgitation and mild pulmonary hypertension with a PA pressure 37 mm. His mean aortic gradient was 7 with a peak gradient of 16.  Additional problems include hypertension and hyperlipidemia.  Since I last saw him, he underwent a 2-D echo Doppler study in April 2016 which showed an ejection fraction of 55-60% with mild concentric LVH and grade 2 diastolic dysfunction.  His mean gradient across his aortic valve was 9 with a peak gradient of 17 and his aortic valve area range from 1.7-2.04 cm.  A nuclear perfusion study revealed normal perfusion.   He has been working hard typically 12-14 hours a day.  He admits to increasing fatigue but denies chest pain or shortness of breath.  He is not resting well.  He is unaware of palpitations.  He denies PND, orthopnea.   When I last saw him, he had developed new deep T-wave inversion inferiorly, which had not been present April 2016 at the time of his stress test.  Plavix was added to his medical rate regimen and nitrates were increased.  On 08/29/2015.  He underwent right and left heart cardiac catheterization.  He normal LV function with an EF of 55%.  His right heart pressures were normal.  There was very minimal aortic valve stenosis.  He has severe native CAD with total occlusion of the very proximal LAD after a small septal perforating artery, total occlusion of the very proximal left circumflex vessel after small OM vessel and total occlusion of the proximal RCA.  He is a patent LIMA graft supplying the mid LAD.  There was a pain, vein graft supplying the distal circumflex marginal with the previously placed stent in the midportion of the graft with intimal hyperplasia.  Narrowing of 30-40% and 50% somewhat eccentric narrowing in the more distal portion of the graft.  He had septal collaterals to the proximal LAD from the distal OM branch.  Vein graft which probably had previously supplied the optional diagonal vessel.  There was a patent vein graft to the diagonal vessel, which with previously placed stent that is widely patent.  In the midportion of the graft.  The vein graft supplying the PDA and a dominant RCA system was patent.  Medical therapy was recommended.  [place] I last saw him, [place] .  His wife had passed away.  Since I last saw  him, in November 2007.  He remained his wife's very close friend who was also a widow.  The past year.  Unfortunately, his son died.  Douglas Bray denies recent anginal symptoms.  He denies significant shortness of breath.  He remains active.   He has not had recent laboratory.  He presents for evaluation.  Allergies  Allergen Reactions  . Ramipril Cough    Current Outpatient Prescriptions  Medication Sig Dispense Refill  . aspirin EC 81 MG  tablet Take 81 mg by mouth daily.    Marland Kitchen atenolol (TENORMIN) 25 MG tablet Take 0.5 tablets (12.5 mg total) by mouth daily. Please call to schedule appointment. Thanks! 15 tablet 0  . clopidogrel (PLAVIX) 75 MG tablet Take 1 tablet (75 mg total) by mouth daily. 90 tablet 3  . Fish Oil OIL Take 1 capsule by mouth 2 (two) times daily.    . isosorbide mononitrate (IMDUR) 60 MG 24 hr tablet Take 1 & 1/2 tablets daily ( 90 mg) 45 tablet 6  . naproxen sodium (ANAPROX) 220 MG tablet Take 220 mg by mouth 2 (two) times daily as needed (pain).    . quinapril (ACCUPRIL) 5 MG tablet TAKE ONE (1) TABLET BY MOUTH ONCE DAILY 90 tablet 2  . simvastatin (ZOCOR) 40 MG tablet TAKE ONE (1) TABLET BY MOUTH ONCE DAILY 90 tablet 3   No current facility-administered medications for this visit.     Social History   Social History  . Marital status: Widowed    Spouse name: N/A  . Number of children: N/A  . Years of education: N/A   Occupational History  . Not on file.   Social History Main Topics  . Smoking status: Former Research scientist (life sciences)  . Smokeless tobacco: Current User    Types: Chew     Comment: quit smoking 50 years ago. chews occas.  . Alcohol use No  . Drug use: No  . Sexual activity: Not on file   Other Topics Concern  . Not on file   Social History Narrative  . No narrative on file   Social history is notable in that he is married has 2 children, one deceased, 5 grandchildren 8 great-grandchildren. There is no call use. He previously chewed tobacco. His wife did suffer a CVA.  No family history on file.  Both parents are deceased.  He has 4 living brothers and one deceased.  ROS General: Negative; No fevers, chills, or night sweats;  HEENT: Negative; No changes in vision or hearing, sinus congestion, difficulty swallowing Pulmonary: Negative; No cough, wheezing, shortness of breath, hemoptysis Cardiovascular: Negative; No chest pain, presyncope, syncope, palpatations GI: Negative; No nausea,  vomiting, diarrhea, or abdominal pain GU: Negative; No dysuria, hematuria, or difficulty voiding Musculoskeletal: Negative; no myalgias, joint pain, or weakness Hematologic/Oncology: Negative; no easy bruising, bleeding Endocrine: Negative; no heat/cold intolerance; no diabetes Neuro: Negative; no changes in balance, headaches Skin: Negative; No rashes or skin lesions Psychiatric: Negative; No behavioral problems, depression Sleep: Negative; No snoring, daytime sleepiness, hypersomnolence, bruxism, restless legs, hypnogognic hallucinations, no cataplexy Other comprehensive 14 point system review is negative.   PE BP 118/66 (BP Location: Right Arm, Patient Position: Sitting, Cuff Size: Normal)   Pulse (!) 57   Ht '5\' 8"'  (1.727 m)   Wt 174 lb (78.9 kg)   BMI 26.46 kg/m    Wt Readings from Last 3 Encounters:  12/13/16 174 lb (78.9 kg)  05/15/16 171 lb (77.6 kg)  09/03/15 168 lb (76.2 kg)  General: Alert, oriented, no distress.  Skin: normal turgor, no rashes HEENT: Normocephalic, atraumatic. Pupils round and reactive; sclera anicteric;no lid lag.  Nose without nasal septal hypertrophy Mouth/Parynx benign; Mallinpatti scale 2 Neck: No JVD, no carotid bruits with normal carotid upstroke Lungs: clear to ausculatation and percussion; no wheezing or rales Chest wall: Nontender to palpation Heart: RRR, s1 s2 normal 2/6 systolic murmur in aortic region concordant with his aortic stenosis. No S3 or S4 gallop; no diastolic murmur, no rubs, thrills or heaves. Abdomen: soft, nontender; no hepatosplenomehaly, BS+; abdominal aorta nontender and not dilated by palpation. Back: No CVA tenderness Pulses 2+ Extremities: no clubbing cyanosis or edema, Homan's sign negative  Neurologic: grossly nonfocal Psychologic: normal affect and mood.  ECG (independently read by me): Sinus bradycardia 57 bpm.  LVH by voltage criteria.  Nonspecific ST changes.  Normal intervals.  August 2017 ECG  (independently read by me): Normal sinus rhythm at 60 bpm.  LVH by voltage criteria.  Previous deep T-wave inversion inferiorly.  Is no longer present.  ECG (independently read by me): Sinus bradycardia 53 bpm with PAC.  New significant T-wave inversion in leads 3 and aVF which was not present on his April 2016 and prior 2015 ECGs.  April 2016 ECG (independently read by me): Sinus bradycardia 50 bpm.  Poor progression V1 through V3.  No significant ST segment changes  May 2015 ECG (independently read by me): Sinus bradycardia 53 beats per minute.  PR interval 176 ms, QTc interval normal at 470 ms.  No significant ST-T changes.  Prior December 2014 ECG: Sinus rhythm at 53 beats per minute. No ectopy. Normal intervals.  LABS:  BMP Latest Ref Rng & Units 08/31/2015 01/11/2015  Glucose 65 - 99 mg/dL 78 87  BUN 7 - 25 mg/dL 21 17  Creatinine 0.70 - 1.11 mg/dL 1.32(H) 1.23  Sodium 135 - 146 mmol/L 141 141  Potassium 3.5 - 5.3 mmol/L 4.9 5.2  Chloride 98 - 110 mmol/L 104 103  CO2 20 - 31 mmol/L 26 29  Calcium 8.6 - 10.3 mg/dL 9.4 9.6   Hepatic Function Latest Ref Rng & Units 01/11/2015  Total Protein 6.0 - 8.3 g/dL 7.1  Albumin 3.5 - 5.2 g/dL 4.3  AST 0 - 37 U/L 20  ALT 0 - 53 U/L 14  Alk Phosphatase 39 - 117 U/L 38(L)  Total Bilirubin 0.2 - 1.2 mg/dL 0.8   CBC Latest Ref Rng & Units 08/31/2015 01/11/2015  WBC 4.0 - 10.5 K/uL 7.9 7.9  Hemoglobin 13.0 - 17.0 g/dL 13.2 14.6  Hematocrit 39.0 - 52.0 % 38.5(L) 42.4  Platelets 150 - 400 K/uL 204 187   Lab Results  Component Value Date   MCV 94.4 08/31/2015   MCV 94.6 01/11/2015   Lab Results  Component Value Date   TSH 5.202 (H) 08/31/2015   No results found for: HGBA1C   Lipid Panel     Component Value Date/Time   CHOL 137 01/11/2015 1127   TRIG 180 (H) 01/11/2015 1127   HDL 37 (L) 01/11/2015 1127   CHOLHDL 3.7 01/11/2015 1127   VLDL 36 01/11/2015 1127   LDLCALC 64 01/11/2015 1127   RADIOLOGY: No results  found.  IMPRESSION:  1. Coronary artery disease due to lipid rich plaque   2. Hx of CABG   3. Mild aortic stenosis   4. Essential hypertension   5. Hyperlipidemia with target LDL less than 70   6. Medication management     ASSESSMENT AND PLAN:  Mr. JERNARD REIBER is an active 83 year old gentleman who is status post CABG revascularization surgery In 1999.  His last nuclear perfusion study in April 2016 continued to show fairly normal perfusion without ischemia.  On his echo in August 2014 he had very mild aortic stenosis with  normal systolic function.  When I  Saw him in 2016 he had developed deep T-wave inversion inferiorly.  He ultimately underwent right and left heart cardiac catheterization with findings as noted above and Iagain  reviewed these findings with him in detail.  Medical therapy was recommended.  He has done well and denies any significant chest pain symptomatology. When I last saw him he had self discontinued Plavix due to easy bruisability.  He is now back on Plavix and is doing well.  His blood pressure today is stable on atenolol 12.5 mg , quinapril 5 mg, in addition to isosorbide 90 mg daily.  Continues to be on simvastatin 40 mg for hyperlipidemia.  Late set of laboratory will be obtained in the fasting state and medications will be adjusted if necessary.  Asked echo Doppler study was in April 2016 and in 2 months.  He will undergo a 2 year follow-up evaluation to reassess his aortic valve.  I will see him in 6 months for cardiology reevaluation.  Time spent: 25 minutes  Troy Sine, MD, [facility]  12/15/2016 7:07 PM

## 2016-12-15 ENCOUNTER — Other Ambulatory Visit: Payer: Self-pay | Admitting: Cardiovascular Disease

## 2016-12-15 DIAGNOSIS — E785 Hyperlipidemia, unspecified: Secondary | ICD-10-CM | POA: Diagnosis not present

## 2016-12-15 DIAGNOSIS — Z79899 Other long term (current) drug therapy: Secondary | ICD-10-CM | POA: Diagnosis not present

## 2016-12-15 DIAGNOSIS — I10 Essential (primary) hypertension: Secondary | ICD-10-CM | POA: Diagnosis not present

## 2016-12-16 LAB — COMPREHENSIVE METABOLIC PANEL
ALT: 11 IU/L (ref 0–44)
AST: 20 IU/L (ref 0–40)
Albumin/Globulin Ratio: 1.4 (ref 1.2–2.2)
Albumin: 4 g/dL (ref 3.5–4.7)
Alkaline Phosphatase: 42 IU/L (ref 39–117)
BILIRUBIN TOTAL: 0.6 mg/dL (ref 0.0–1.2)
BUN / CREAT RATIO: 11 (ref 10–24)
BUN: 16 mg/dL (ref 8–27)
CALCIUM: 9.7 mg/dL (ref 8.6–10.2)
CO2: 25 mmol/L (ref 18–29)
Chloride: 101 mmol/L (ref 96–106)
Creatinine, Ser: 1.46 mg/dL — ABNORMAL HIGH (ref 0.76–1.27)
GFR calc Af Amer: 50 mL/min/{1.73_m2} — ABNORMAL LOW (ref >59)
GFR, EST NON AFRICAN AMERICAN: 43 mL/min/{1.73_m2} — AB (ref >59)
GLUCOSE: 92 mg/dL (ref 65–99)
Globulin, Total: 2.8 g/dL (ref 1.5–4.5)
POTASSIUM: 4.8 mmol/L (ref 3.5–5.2)
SODIUM: 141 mmol/L (ref 134–144)
TOTAL PROTEIN: 6.8 g/dL (ref 6.0–8.5)

## 2016-12-16 LAB — CBC WITH DIFFERENTIAL/PLATELET
BASOS ABS: 0 10*3/uL (ref 0.0–0.2)
Basos: 1 %
EOS (ABSOLUTE): 0.3 10*3/uL (ref 0.0–0.4)
Eos: 5 %
Hematocrit: 40.4 % (ref 37.5–51.0)
Hemoglobin: 13.5 g/dL (ref 13.0–17.7)
IMMATURE GRANS (ABS): 0 10*3/uL (ref 0.0–0.1)
IMMATURE GRANULOCYTES: 0 %
LYMPHS: 37 %
Lymphocytes Absolute: 2.3 10*3/uL (ref 0.7–3.1)
MCH: 32.5 pg (ref 26.6–33.0)
MCHC: 33.4 g/dL (ref 31.5–35.7)
MCV: 97 fL (ref 79–97)
Monocytes Absolute: 0.6 10*3/uL (ref 0.1–0.9)
Monocytes: 10 %
NEUTROS PCT: 47 %
Neutrophils Absolute: 3 10*3/uL (ref 1.4–7.0)
PLATELETS: 185 10*3/uL (ref 150–379)
RBC: 4.16 x10E6/uL (ref 4.14–5.80)
RDW: 15.4 % (ref 12.3–15.4)
WBC: 6.3 10*3/uL (ref 3.4–10.8)

## 2016-12-16 LAB — LIPID PANEL W/O CHOL/HDL RATIO
CHOLESTEROL TOTAL: 146 mg/dL (ref 100–199)
HDL: 42 mg/dL (ref >39)
LDL CALC: 72 mg/dL (ref 0–99)
Triglycerides: 161 mg/dL — ABNORMAL HIGH (ref 0–149)
VLDL Cholesterol Cal: 32 mg/dL (ref 5–40)

## 2016-12-16 LAB — TSH: TSH: 8.18 u[IU]/mL — AB (ref 0.450–4.500)

## 2016-12-26 ENCOUNTER — Other Ambulatory Visit: Payer: Self-pay | Admitting: Cardiovascular Disease

## 2016-12-27 ENCOUNTER — Telehealth: Payer: Self-pay | Admitting: Cardiovascular Disease

## 2016-12-27 NOTE — Telephone Encounter (Signed)
New Message °

## 2016-12-27 NOTE — Telephone Encounter (Signed)
Spoke with pt states that he received a letter and he lost it. Pt Epic letter was to call and schedule an ECHO gave pt number to call [facility] [address] office and schedule

## 2016-12-27 NOTE — Telephone Encounter (Signed)
New Message  Pt voiced wanting to speak to someone in regards to a letter and to have it resent out.  Please f/u

## 2016-12-28 ENCOUNTER — Encounter: Payer: Self-pay | Admitting: Cardiovascular Disease

## 2016-12-29 ENCOUNTER — Other Ambulatory Visit: Payer: Self-pay | Admitting: *Deleted

## 2016-12-29 MED ORDER — ATENOLOL 25 MG PO TABS: 12.5000 mg | ORAL_TABLET | Freq: Every day | ORAL | 3 refills | Status: DC

## 2016-12-29 NOTE — Telephone Encounter (Signed)
E SENT TO PHARMACY 

## 2017-01-05 ENCOUNTER — Ambulatory Visit (HOSPITAL_COMMUNITY): Payer: Medicare Other | Attending: Cardiology

## 2017-01-05 ENCOUNTER — Other Ambulatory Visit: Payer: Self-pay

## 2017-01-05 DIAGNOSIS — I2583 Coronary atherosclerosis due to lipid rich plaque: Secondary | ICD-10-CM | POA: Insufficient documentation

## 2017-01-05 DIAGNOSIS — Z951 Presence of aortocoronary bypass graft: Secondary | ICD-10-CM | POA: Insufficient documentation

## 2017-01-05 DIAGNOSIS — I2510 Atherosclerotic heart disease of native coronary artery without angina pectoris: Secondary | ICD-10-CM | POA: Diagnosis not present

## 2017-01-05 DIAGNOSIS — I350 Nonrheumatic aortic (valve) stenosis: Secondary | ICD-10-CM | POA: Diagnosis not present

## 2017-01-05 DIAGNOSIS — I352 Nonrheumatic aortic (valve) stenosis with insufficiency: Secondary | ICD-10-CM | POA: Insufficient documentation

## 2017-01-05 DIAGNOSIS — I119 Hypertensive heart disease without heart failure: Secondary | ICD-10-CM | POA: Diagnosis not present

## 2017-01-05 DIAGNOSIS — Z87891 Personal history of nicotine dependence: Secondary | ICD-10-CM | POA: Insufficient documentation

## 2017-01-05 DIAGNOSIS — E785 Hyperlipidemia, unspecified: Secondary | ICD-10-CM | POA: Insufficient documentation

## 2017-05-03 ENCOUNTER — Other Ambulatory Visit: Payer: Self-pay | Admitting: *Deleted

## 2017-05-03 ENCOUNTER — Encounter: Payer: Self-pay | Admitting: *Deleted

## 2017-05-03 DIAGNOSIS — R7989 Other specified abnormal findings of blood chemistry: Secondary | ICD-10-CM

## 2017-07-02 DIAGNOSIS — R946 Abnormal results of thyroid function studies: Secondary | ICD-10-CM | POA: Diagnosis not present

## 2017-07-02 LAB — TSH: TSH: 8.95 u[IU]/mL — ABNORMAL HIGH (ref 0.450–4.500)

## 2017-07-02 LAB — T4, FREE: Free T4: 0.96 ng/dL (ref 0.82–1.77)

## 2017-07-06 ENCOUNTER — Other Ambulatory Visit: Payer: Self-pay | Admitting: *Deleted

## 2017-07-06 MED ORDER — LEVOTHYROXINE SODIUM 25 MCG PO TABS: 25.0000 ug | ORAL_TABLET | Freq: Every day | ORAL | 3 refills | Status: DC

## 2017-07-11 ENCOUNTER — Other Ambulatory Visit: Payer: Self-pay | Admitting: Cardiovascular Disease

## 2017-07-18 ENCOUNTER — Encounter: Payer: Self-pay | Admitting: Cardiovascular Disease

## 2017-07-18 ENCOUNTER — Ambulatory Visit (INDEPENDENT_AMBULATORY_CARE_PROVIDER_SITE_OTHER): Payer: Medicare Other | Admitting: Cardiovascular Disease

## 2017-07-18 VITALS — BP 112/62 | HR 52 | Ht 68.0 in | Wt 171.8 lb

## 2017-07-18 DIAGNOSIS — E785 Hyperlipidemia, unspecified: Secondary | ICD-10-CM | POA: Diagnosis not present

## 2017-07-18 DIAGNOSIS — I2510 Atherosclerotic heart disease of native coronary artery without angina pectoris: Secondary | ICD-10-CM

## 2017-07-18 DIAGNOSIS — I350 Nonrheumatic aortic (valve) stenosis: Secondary | ICD-10-CM | POA: Diagnosis not present

## 2017-07-18 DIAGNOSIS — Z79899 Other long term (current) drug therapy: Secondary | ICD-10-CM

## 2017-07-18 DIAGNOSIS — R7989 Other specified abnormal findings of blood chemistry: Secondary | ICD-10-CM | POA: Diagnosis not present

## 2017-07-18 DIAGNOSIS — Z951 Presence of aortocoronary bypass graft: Secondary | ICD-10-CM

## 2017-07-18 DIAGNOSIS — I10 Essential (primary) hypertension: Secondary | ICD-10-CM

## 2017-07-18 DIAGNOSIS — I440 Atrioventricular block, first degree: Secondary | ICD-10-CM

## 2017-07-18 NOTE — Addendum Note (Signed)
Addended by: Zebedee Iba on: 07/18/2017 04:37 PM   Modules accepted: Orders

## 2017-07-18 NOTE — Progress Notes (Signed)
Patient ID: Douglas Bray, male   DOB: 01/12/1929, 83 y.o.   MRN: [id number]     HPI: Douglas Bray is a 83 y.o. male who presents for an 7 month cardiology evaluation.  Douglas Bray has known CAD and in April 1999 underwent CABG revascularization surgery LIMA to the LAD, vein to the diagonal, vein to the marginal, and vein to the PDA as well as optional diagonal vessel. In April 2002 he underwent complex 2 vessel intervention to the diagonal vessel with stenting as well as stenting to the circumflex graft. In January 2012 echo Doppler study suggested normal systolic function with diastolic dysfunction. He had moderate to severe tricuspid regurgitation with mild pulmonary hypertension, mild aortic insufficiency, mild mitral regurgitation.  In May 2013 due to episodes of left arm discomfort a nuclear perfusion study was performed which continued to show normal perfusion.  An echo Doppler study on 05/14/2013  revealed mild LVH with normal systolic function. Diastolic parameters were normal. There was very mild aortic stenosis with mildly thickened trileaflet valve with mild AR. He had mild mitral regurgitation, moderately severe tricuspid regurgitation and mild pulmonary hypertension with a PA pressure 37 mm. His mean aortic gradient was 7 with a peak gradient of 16.  Additional problems include hypertension and hyperlipidemia.  Since I last saw him, he underwent a 2-D echo Doppler study in April 2016 which showed an ejection fraction of 55-60% with mild concentric LVH and grade 2 diastolic dysfunction.  His mean gradient across his aortic valve was 9 with a peak gradient of 17 and his aortic valve area range from 1.7-2.04 cm.  A nuclear perfusion study revealed normal perfusion.   He has been working hard typically 12-14 hours a day.  He admits to increasing fatigue but denies chest pain or shortness of breath.  He is not resting well.  He is unaware of palpitations.  He denies PND, orthopnea.   When I last saw him, he had developed new deep T-wave inversion inferiorly, which had not been present April 2016 at the time of his stress test.  Plavix was added to his medical rate regimen and nitrates were increased.  On 08/29/2015.  He underwent right and left heart cardiac catheterization.  He normal LV function with an EF of 55%.  His right heart pressures were normal.  There was very minimal aortic valve stenosis.  He has severe native CAD with total occlusion of the very proximal LAD after a small septal perforating artery, total occlusion of the very proximal left circumflex vessel after small OM vessel and total occlusion of the proximal RCA.  He is a patent LIMA graft supplying the mid LAD.  There was a pain, vein graft supplying the distal circumflex marginal with the previously placed stent in the midportion of the graft with intimal hyperplasia.  Narrowing of 30-40% and 50% somewhat eccentric narrowing in the more distal portion of the graft.  He had septal collaterals to the proximal LAD from the distal OM branch.  Vein graft which probably had previously supplied the optional diagonal vessel.  There was a patent vein graft to the diagonal vessel, which with previously placed stent that is widely patent.  In the midportion of the graft.  The vein graft supplying the PDA and a dominant RCA system was patent.  Medical therapy was recommended.  [place] after his wife of 7 years had passed away.  He 09-13-2016, he re-married his wife's best  friend.  When I last saw him, I recommended he undergo a follow-up echo Doppler study.  As was done on 01/05/2017 and essentially was unchanged from previously.  He had normal EF at 55-60%, mild LVH, grade 2 diastolic dysfunction, and mild aortic valve stenosis with mild AR.  There was mild LA dilatation and mild pulmonary hypertension.  His mean aortic gradient was 12 mm with a peak gradient of 22 mm.  Several weeks ago, he had  follow-up laboratory which reconfirmed mild TSH elevation which had increased 8.95.  I called in levothyroxine, initially at 25 g with plans for follow-up TSH in 4 weeks.  He denies any chest pain.  He denies palpitations.  He does experience any discomfort.  He presents for reevaluation.  Allergies  Allergen Reactions  . Ramipril Cough    Current Outpatient Prescriptions  Medication Sig Dispense Refill  . aspirin EC 81 MG tablet Take 81 mg by mouth daily.    Marland Kitchen atenolol (TENORMIN) 25 MG tablet Take 0.5 tablets (12.5 mg total) by mouth daily. 45 tablet 3  . clopidogrel (PLAVIX) 75 MG tablet Take 1 tablet (75 mg total) by mouth daily. 90 tablet 3  . Fish Oil OIL Take 1 capsule by mouth 2 (two) times daily.    . isosorbide mononitrate (IMDUR) 60 MG 24 hr tablet TAKE 1 AND 1/2 TABLETS DAILY 45 tablet 3  . levothyroxine (SYNTHROID, LEVOTHROID) 25 MCG tablet Take 1 tablet (25 mcg total) by mouth daily before breakfast. 30 tablet 3  . naproxen sodium (ANAPROX) 220 MG tablet Take 220 mg by mouth 2 (two) times daily as needed (pain).    . quinapril (ACCUPRIL) 5 MG tablet TAKE ONE (1) TABLET BY MOUTH ONCE DAILY 90 tablet 2  . simvastatin (ZOCOR) 40 MG tablet TAKE ONE (1) TABLET BY MOUTH ONCE DAILY 90 tablet 3   No current facility-administered medications for this visit.     Social History   Social History  . Marital status: Widowed    Spouse name: N/A  . Number of children: N/A  . Years of education: N/A   Occupational History  . Not on file.   Social History Main Topics  . Smoking status: Former Research scientist (life sciences)  . Smokeless tobacco: Current User    Types: Chew     Comment: quit smoking 50 years ago. chews occas.  . Alcohol use No  . Drug use: No  . Sexual activity: Not on file   Other Topics Concern  . Not on file   Social History Narrative  . No narrative on file   Social history is notable in that he is married has 2 children, one deceased, 5 grandchildren 8 great-grandchildren.  There is no call use. He previously chewed tobacco. His wife did suffer a CVA.  No family history on file.  Both parents are deceased.  He has 4 living brothers and one deceased.  ROS General: Negative; No fevers, chills, or night sweats;  HEENT: Negative; No changes in vision or hearing, sinus congestion, difficulty swallowing Pulmonary: Negative; No cough, wheezing, shortness of breath, hemoptysis Cardiovascular: Negative; No chest pain, presyncope, syncope, palpatations GI: Negative; No nausea, vomiting, diarrhea, or abdominal pain GU: Negative; No dysuria, hematuria, or difficulty voiding Musculoskeletal: Positive for bilateral knee discomfort Hematologic/Oncology: Negative; no easy bruising, bleeding Endocrine: Negative; no heat/cold intolerance; no diabetes Neuro: Negative; no changes in balance, headaches Skin: Negative; No rashes or skin lesions Psychiatric: Negative; No behavioral problems, depression Sleep: Negative; No snoring, daytime sleepiness,  hypersomnolence, bruxism, restless legs, hypnogognic hallucinations, no cataplexy Other comprehensive 14 point system review is negative.   PE BP 112/62   Pulse (!) 52   Ht _0  (1.727 m)   Wt 171 lb 12.8 oz (77.9 kg)   BMI 26.12 kg/m    Repeat blood pressure by me was 128/64  Wt Readings from Last 3 Encounters:  07/18/17 171 lb 12.8 oz (77.9 kg)  12/13/16 174 lb (78.9 kg)  05/15/16 171 lb (77.6 kg)   General: Alert, oriented, no distress.  Skin: normal turgor, no rashes, warm and dry HEENT: Normocephalic, atraumatic. Pupils equal round and reactive to light; sclera anicteric; extraocular muscles intact;  Nose without nasal septal hypertrophy Mouth/Parynx benign; Mallinpatti scale 2 Neck: No JVD, no carotid bruits; normal carotid upstroke Lungs: clear to ausculatation and percussion; no wheezing or rales Chest wall: without tenderness to palpitation Heart: PMI not displaced, RRR, s1 s2 normal, 2/6 systolic murmur,  no diastolic murmur, no rubs, gallops, thrills, or heaves Abdomen: soft, nontender; no hepatosplenomehaly, BS+; abdominal aorta nontender and not dilated by palpation. Back: no CVA tenderness Pulses 2+ Musculoskeletal: full range of motion, normal strength, no joint deformities Extremities: no clubbing cyanosis or edema, Homan's sign negative  Neurologic: grossly nonfocal; Cranial nerves grossly wnl Psychologic: Normal mood and affect   ECG (independently read by me): Sinus bradycardia at 52 bpm.  First-degree AV block with a PR interval of 214 ms.  Borderline voltage criteria for LVH.  QTc interval 403 ms.  No significant ST-T changes.  ECG (independently read by me): Sinus bradycardia 57 bpm.  LVH by voltage criteria.  Nonspecific ST changes.  Normal intervals.  August 2017 ECG (independently read by me): Normal sinus rhythm at 60 bpm.  LVH by voltage criteria.  Previous deep T-wave inversion inferiorly.  Is no longer present.  ECG (independently read by me): Sinus bradycardia 53 bpm with PAC.  New significant T-wave inversion in leads 3 and aVF which was not present on his April 2016 and prior 2015 ECGs.  April 2016 ECG (independently read by me): Sinus bradycardia 50 bpm.  Poor progression V1 through V3.  No significant ST segment changes  May 2015 ECG (independently read by me): Sinus bradycardia 53 beats per minute.  PR interval 176 ms, QTc interval normal at 470 ms.  No significant ST-T changes.  Prior December 2014 ECG: Sinus rhythm at 53 beats per minute. No ectopy. Normal intervals.  LABS:  BMP Latest Ref Rng & Units 12/15/2016 08/31/2015 01/11/2015  Glucose 65 - 99 mg/dL 92 78 87  BUN 8 - 27 mg/dL _1 Creatinine 0.76 - 1.27 mg/dL 1.46(H) 1.32(H) 1.23  BUN/Creat Ratio 10 - 24 11 - -  Sodium 134 - 144 mmol/L 141 141 141  Potassium 3.5 - 5.2 mmol/L 4.8 4.9 5.2  Chloride 96 - 106 mmol/L 101 104 103  CO2 18 - 29 mmol/L _2 Calcium 8.6 - 10.2 mg/dL 9.7 9.4 9.6    Hepatic Function Latest Ref Rng & Units 12/15/2016 01/11/2015  Total Protein 6.0 - 8.5 g/dL 6.8 7.1  Albumin 3.5 - 4.7 g/dL 4.0 4.3  AST 0 - 40 IU/L 20 20  ALT 0 - 44 IU/L 11 14  Alk Phosphatase 39 - 117 IU/L 42 38(L)  Total Bilirubin 0.0 - 1.2 mg/dL 0.6 0.8   CBC Latest Ref Rng & Units 12/15/2016 08/31/2015 01/11/2015  WBC 3.4 - 10.8 x10E3/uL 6.3 7.9 7.9  Hemoglobin 13.0 - 17.7  g/dL 13.5 13.2 14.6  Hematocrit 37.5 - 51.0 % 40.4 38.5(L) 42.4  Platelets 150 - 379 x10E3/uL 185 204 187   Lab Results  Component Value Date   MCV 97 12/15/2016   MCV 94.4 08/31/2015   MCV 94.6 01/11/2015   Lab Results  Component Value Date   TSH 8.950 (H) 07/02/2017   No results found for: HGBA1C   Lipid Panel     Component Value Date/Time   CHOL 146 12/15/2016 0907   TRIG 161 (H) 12/15/2016 0907   HDL 42 12/15/2016 0907   CHOLHDL 3.7 01/11/2015 1127   VLDL 36 01/11/2015 1127   LDLCALC 72 12/15/2016 0907   RADIOLOGY: No results found.  IMPRESSION:  1. Elevated TSH   2. CAD in native artery   3. Hx of CABG   4. Medication management   5. Mild aortic stenosis   6. Essential hypertension   7. Hyperlipidemia with target LDL less than 70   8. First degree heart block     ASSESSMENT AND PLAN: Mr. TRE SANKER is an active 83 year old gentleman who is status post CABG revascularization surgery In 1999.  His last nuclear perfusion study in April 2016 continued to show fairly normal perfusion without ischemia.  On his echo in August 2014 he had very mild aortic stenosis with  normal systolic function. In 2016 he was found to have deep T-wave inversion inferiorly and I performed right and left heart cardiac catheterization with findings as noted above and Iagain  reviewed these findings with him in detail.  Medical therapy was recommended.  I reviewed his most recent echo Doppler study from 01/05/2017 with him in detail today.  He has mild LVH with normal systolic function, grade 2 diastolic  dysfunction, he continues to have mild aortic valve stenosis with a mean gradient of 12 and peak gradient of 22.  He's not having any symptoms of chest pain, PND, orthopnea.  He is unaware of palpitations.  He denies presyncope or syncope.  He was recently started on low-dose levothyroxine 25 g due to several recent mild elevations of his TSH level.  I will recheck his TSH 3 weeks, which will be a proximally 5 weeks after initiation of his low-dose therapy.  He continues to be on dual antiplatelet therapy with aspirin and Plavix.  He continues to be on isosorbide 90 mg for his CAD and is without angina.  His blood pressure today is controlled and he has been on quinapril 5 mg daily for many years.  In addition to atenolol 12.5 mg daily.  His ECG remains stable with sinus bradycardia and first-degree heart block.  He is tolerating simvastatin 40 mg without myalgias.  Recent LDL was 72.  Triglycerides are minimally increased at 161, and mild dietary modification was recommended.  As long as he remains stable, I will see him in 6 months for reevaluation.  Time spent: 25 minutes  Troy Sine, MD, [facility]  07/18/2017 11:29 AM

## 2017-07-18 NOTE — Patient Instructions (Signed)
Medication Instructions:  Your physician recommends that you continue on your current medications as directed. Please refer to the Current Medication list given to you today.  Labwork: Please return for FASTING labs in 3 weeks (TSH)  Our in office lab hours are Monday-Friday 8:00-4:30, closed for lunch 1-2 pm.  No appointment needed.  Follow-Up: Your physician wants you to follow-up in: 6 MONTHS with Dr. Claiborne Billings.  You will receive a reminder letter in the mail two months in advance. If you don't receive a letter, please call our office to schedule the follow-up appointment.   Any Other Special Instructions Will Be Listed Below (If Applicable).     If you need a refill on your cardiac medications before your next appointment, please call your pharmacy.

## 2017-07-19 DIAGNOSIS — Z23 Encounter for immunization: Secondary | ICD-10-CM | POA: Diagnosis not present

## 2017-08-06 DIAGNOSIS — I25810 Atherosclerosis of coronary artery bypass graft(s) without angina pectoris: Secondary | ICD-10-CM | POA: Diagnosis not present

## 2017-08-06 DIAGNOSIS — E039 Hypothyroidism, unspecified: Secondary | ICD-10-CM | POA: Diagnosis not present

## 2017-08-06 DIAGNOSIS — Z0000 Encounter for general adult medical examination without abnormal findings: Secondary | ICD-10-CM | POA: Diagnosis not present

## 2017-08-06 DIAGNOSIS — R6889 Other general symptoms and signs: Secondary | ICD-10-CM | POA: Diagnosis not present

## 2017-08-06 DIAGNOSIS — E785 Hyperlipidemia, unspecified: Secondary | ICD-10-CM | POA: Diagnosis not present

## 2017-09-03 ENCOUNTER — Telehealth: Payer: Self-pay | Admitting: Cardiovascular Disease

## 2017-09-03 NOTE — Telephone Encounter (Signed)
Spoke to patient in regards to lab results.   States PCP did not change his Synthroid based on the results -that he was going to let Dr. Claiborne Billings make the changes.     Advised I would route to Dr. Claiborne Billings to review and/or make recommendations.

## 2017-09-03 NOTE — Telephone Encounter (Signed)
Returning your call from 08-28-17,concerning his lab results.

## 2017-09-06 NOTE — Telephone Encounter (Signed)
Reviewed with Dr. Claiborne Billings, no changes to medications at this time

## 2017-09-07 NOTE — Telephone Encounter (Signed)
Patient made aware and verbalized understanding.

## 2017-10-04 DIAGNOSIS — L820 Inflamed seborrheic keratosis: Secondary | ICD-10-CM | POA: Diagnosis not present

## 2017-10-04 DIAGNOSIS — L853 Xerosis cutis: Secondary | ICD-10-CM | POA: Diagnosis not present

## 2017-10-04 DIAGNOSIS — L814 Other melanin hyperpigmentation: Secondary | ICD-10-CM | POA: Diagnosis not present

## 2017-11-06 DIAGNOSIS — I2510 Atherosclerotic heart disease of native coronary artery without angina pectoris: Secondary | ICD-10-CM | POA: Diagnosis not present

## 2017-11-06 DIAGNOSIS — E039 Hypothyroidism, unspecified: Secondary | ICD-10-CM | POA: Diagnosis not present

## 2017-11-06 DIAGNOSIS — I10 Essential (primary) hypertension: Secondary | ICD-10-CM | POA: Diagnosis not present

## 2017-11-06 DIAGNOSIS — E785 Hyperlipidemia, unspecified: Secondary | ICD-10-CM | POA: Diagnosis not present

## 2017-11-12 ENCOUNTER — Other Ambulatory Visit: Payer: Self-pay | Admitting: Cardiovascular Disease

## 2017-11-13 NOTE — Telephone Encounter (Signed)
Rx(s) sent to pharmacy electronically.  

## 2017-12-10 ENCOUNTER — Other Ambulatory Visit: Payer: Self-pay | Admitting: Cardiovascular Disease

## 2017-12-10 NOTE — Telephone Encounter (Signed)
REFILL 

## 2017-12-31 DIAGNOSIS — E039 Hypothyroidism, unspecified: Secondary | ICD-10-CM | POA: Diagnosis not present

## 2018-01-22 ENCOUNTER — Other Ambulatory Visit: Payer: Self-pay | Admitting: Cardiovascular Disease

## 2018-02-08 ENCOUNTER — Encounter: Payer: Self-pay | Admitting: Cardiovascular Disease

## 2018-02-08 ENCOUNTER — Ambulatory Visit: Payer: Medicare Other | Admitting: Cardiovascular Disease

## 2018-02-08 VITALS — BP 118/70 | HR 56 | Ht 68.0 in | Wt 168.0 lb

## 2018-02-08 DIAGNOSIS — I2510 Atherosclerotic heart disease of native coronary artery without angina pectoris: Secondary | ICD-10-CM | POA: Diagnosis not present

## 2018-02-08 DIAGNOSIS — E785 Hyperlipidemia, unspecified: Secondary | ICD-10-CM

## 2018-02-08 DIAGNOSIS — I350 Nonrheumatic aortic (valve) stenosis: Secondary | ICD-10-CM | POA: Diagnosis not present

## 2018-02-08 DIAGNOSIS — Z79899 Other long term (current) drug therapy: Secondary | ICD-10-CM

## 2018-02-08 DIAGNOSIS — E039 Hypothyroidism, unspecified: Secondary | ICD-10-CM | POA: Diagnosis not present

## 2018-02-08 DIAGNOSIS — Z951 Presence of aortocoronary bypass graft: Secondary | ICD-10-CM | POA: Diagnosis not present

## 2018-02-08 DIAGNOSIS — I10 Essential (primary) hypertension: Secondary | ICD-10-CM

## 2018-02-08 MED ORDER — NITROGLYCERIN 0.4 MG SL SUBL: 0.4000 mg | SUBLINGUAL_TABLET | SUBLINGUAL | 1 refills | Status: AC | PRN

## 2018-02-08 MED ORDER — LEVOTHYROXINE SODIUM 50 MCG PO TABS: 50.0000 ug | ORAL_TABLET | ORAL | 1 refills | Status: DC

## 2018-02-08 NOTE — Progress Notes (Signed)
Patient ID: Douglas Bray, male   DOB: 1928/10/12, 84 y.o.   MRN: [id number]     HPI: Douglas Bray is a 84 y.o. male who presents for an 7 month cardiology evaluation.  Douglas Bray has known CAD and in April 1999 underwent CABG revascularization surgery LIMA to the LAD, vein to the diagonal, vein to the marginal, and vein to the PDA as well as optional diagonal vessel. In April 2002 he underwent complex 2 vessel intervention to the diagonal vessel with stenting as well as stenting to the circumflex graft. In January 2012 echo Doppler study suggested normal systolic function with diastolic dysfunction. He had moderate to severe tricuspid regurgitation with mild pulmonary hypertension, mild aortic insufficiency, mild mitral regurgitation.  In May 2013 due to episodes of left arm discomfort a nuclear perfusion study was performed which continued to show normal perfusion.  An echo Doppler study on 05/14/2013  revealed mild LVH with normal systolic function. Diastolic parameters were normal. There was very mild aortic stenosis with mildly thickened trileaflet valve with mild AR. He had mild mitral regurgitation, moderately severe tricuspid regurgitation and mild pulmonary hypertension with a PA pressure 37 mm. His mean aortic gradient was 7 with a peak gradient of 16.  Additional problems include hypertension and hyperlipidemia.  Since I last saw him, he underwent a 2-D echo Doppler study in April 2016 which showed an ejection fraction of 55-60% with mild concentric LVH and grade 2 diastolic dysfunction.  His mean gradient across his aortic valve was 9 with a peak gradient of 17 and his aortic valve area range from 1.7-2.04 cm.  A nuclear perfusion study revealed normal perfusion.   He has been working hard typically 12-14 hours a day.  He admits to increasing fatigue but denies chest pain or shortness of breath.  He is not resting well.  He is unaware of palpitations.  He denies PND, orthopnea.   When I last saw him, he had developed new deep T-wave inversion inferiorly, which had not been present April 2016 at the time of his stress test.  Plavix was added to his medical rate regimen and nitrates were increased.  On 08/29/2015.  He underwent right and left heart cardiac catheterization.  He normal LV function with an EF of 55%.  His right heart pressures were normal.  There was very minimal aortic valve stenosis.  He has severe native CAD with total occlusion of the very proximal LAD after a small septal perforating artery, total occlusion of the very proximal left circumflex vessel after small OM vessel and total occlusion of the proximal RCA.  He is a patent LIMA graft supplying the mid LAD.  There was a pain, vein graft supplying the distal circumflex marginal with the previously placed stent in the midportion of the graft with intimal hyperplasia.  Narrowing of 30-40% and 50% somewhat eccentric narrowing in the more distal portion of the graft.  He had septal collaterals to the proximal LAD from the distal OM branch.  Vein graft which probably had previously supplied the optional diagonal vessel.  There was a patent vein graft to the diagonal vessel, which with previously placed stent that is widely patent.  In the midportion of the graft.  The vein graft supplying the PDA and a dominant RCA system was patent.  Medical therapy was recommended.  [place] after his wife of 94 years, 2 months and 15 days had passed away.  He 2016/08/12,  he re-married his wife's best friend.  When I last saw him, I recommended he undergo a follow-up echo Doppler study.  As was done on 01/05/2017 and essentially was unchanged from previously.  He had normal EF at 55-60%, mild LVH, grade 2 diastolic dysfunction, and mild aortic valve stenosis with mild AR.  There was mild LA dilatation and mild pulmonary hypertension.  His mean aortic gradient was 12 mm with a peak gradient of 22 mm.  He had  follow-up laboratory which reconfirmed mild TSH elevation which had increased 8.95.  I called in levothyroxine initially at 25 g which was titrated to 50 ug.  Since I last saw him, he has continued to do well.  He specifically denies any chest pain PND orthopnea.  He denies any presyncope or syncope.  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  He does have some arthritic issues.  He is unaware palpitations.  Laboratory in October 2018 showed a total cholesterol 128, LDL 59.  Triglycerides are 158.  He had normal renal function.  He presents for reevaluation.  Allergies  Allergen Reactions  . Ramipril Cough    Current Outpatient Medications  Medication Sig Dispense Refill  . aspirin EC 81 MG tablet Take 81 mg by mouth daily.    Marland Kitchen atenolol (TENORMIN) 25 MG tablet Take 0.5 tablets (12.5 mg total) by mouth daily. NEED OV. 45 tablet 0  . clopidogrel (PLAVIX) 75 MG tablet TAKE ONE (1) TABLET ONCE DAILY 90 tablet 3  . Fish Oil OIL Take 1 capsule by mouth 2 (two) times daily.    . isosorbide mononitrate (IMDUR) 60 MG 24 hr tablet TAKE 1 & 1/2 TABLET BY MOUTH DAILY 45 tablet 6  . Methylcobalamin (B12-ACTIVE PO) Take 1 tablet by mouth daily.    . naproxen sodium (ANAPROX) 220 MG tablet Take 220 mg by mouth 2 (two) times daily as needed (pain).    . nitroGLYCERIN (NITROSTAT) 0.4 MG SL tablet Place 1 tablet (0.4 mg total) under the tongue every 5 (five) minutes as needed for chest pain (x 3 doses). 30 tablet 1  . quinapril (ACCUPRIL) 5 MG tablet TAKE ONE (1) TABLET ONCE DAILY 90 tablet 3  . simvastatin (ZOCOR) 20 MG tablet Take 1 tablet by mouth every evening.    Marland Kitchen levothyroxine (SYNTHROID, LEVOTHROID) 50 MCG tablet Take 1 tablet (50 mcg total) by mouth every morning. 90 tablet 1   No current facility-administered medications for this visit.     Social History   Socioeconomic History  . Marital status: Widowed    Spouse name: Not on file  . Number of children: Not on file  . Years of  education: Not on file  . Highest education level: Not on file  Occupational History  . Not on file  Social Needs  . Financial resource strain: Not on file  . Food insecurity:    Worry: Not on file    Inability: Not on file  . Transportation needs:    Medical: Not on file    Non-medical: Not on file  Tobacco Use  . Smoking status: Former Research scientist (life sciences)  . Smokeless tobacco: Current User    Types: Chew  . Tobacco comment: quit smoking 50 years ago. chews occas.  Substance and Sexual Activity  . Alcohol use: No  . Drug use: No  . Sexual activity: Not on file  Lifestyle  . Physical activity:    Days per week: Not on file    Minutes per  session: Not on file  . Stress: Not on file  Relationships  . Social connections:    Talks on phone: Not on file    Gets together: Not on file    Attends religious service: Not on file    Active member of club or organization: Not on file    Attends meetings of clubs or organizations: Not on file    Relationship status: Not on file  . Intimate partner violence:    Fear of current or ex partner: Not on file    Emotionally abused: Not on file    Physically abused: Not on file    Forced sexual activity: Not on file  Other Topics Concern  . Not on file  Social History Narrative  . Not on file   Social history is notable in that he is married has 2 children, one deceased, 5 grandchildren 8 great-grandchildren. There is no call use. He previously chewed tobacco. His wife did suffer a CVA.  No family history on file.  Both parents are deceased.  He has 4 living brothers and one deceased.  ROS General: Negative; No fevers, chills, or night sweats;  HEENT: Negative; No changes in vision or hearing, sinus congestion, difficulty swallowing Pulmonary: Negative; No cough, wheezing, shortness of breath, hemoptysis Cardiovascular: Negative; No chest pain, presyncope, syncope, palpatations GI: Negative; No nausea, vomiting, diarrhea, or abdominal pain GU:  Negative; No dysuria, hematuria, or difficulty voiding Musculoskeletal: Positive for bilateral knee discomfort Hematologic/Oncology: Negative; no easy bruising, bleeding Endocrine: Negative; no heat/cold intolerance; no diabetes Neuro: Negative; no changes in balance, headaches Skin: Negative; No rashes or skin lesions Psychiatric: Negative; No behavioral problems, depression Sleep: Negative; No snoring, daytime sleepiness, hypersomnolence, bruxism, restless legs, hypnogognic hallucinations, no cataplexy Other comprehensive 14 point system review is negative.   PE BP 118/70   Pulse (!) 56   Ht '5\' 8"'  (1.727 m)   Wt 168 lb (76.2 kg)   BMI 25.54 kg/m    Repeat blood pressure by me was 120/72  Wt Readings from Last 3 Encounters:  02/08/18 168 lb (76.2 kg)  07/18/17 171 lb 12.8 oz (77.9 kg)  12/13/16 174 lb (78.9 kg)   General: Alert, oriented, no distress.  Skin: normal turgor, no rashes, warm and dry HEENT: Normocephalic, atraumatic. Pupils equal round and reactive to light; sclera anicteric; extraocular muscles intact;  Nose without nasal septal hypertrophy Mouth/Parynx benign; Mallinpatti scale 2 Neck: No JVD, transmitted murmur to his carotids bilaterally Lungs: clear to ausculatation and percussion; no wheezing or rales Chest wall: without tenderness to palpitation Heart: PMI not displaced, RRR, s1 s2 normal, 2/6 systolic murmur in the aortic area consistent with aortic stenosis, no diastolic murmur, no rubs, gallops, thrills, or heaves Abdomen: soft, nontender; no hepatosplenomehaly, BS+; abdominal aorta nontender and not dilated by palpation. Back: no CVA tenderness Pulses 2+ Musculoskeletal: full range of motion, normal strength, no joint deformities Extremities: no clubbing cyanosis or edema, Homan's sign negative  Neurologic: grossly nonfocal; Cranial nerves grossly wnl Psychologic: Normal mood and affect   ECG (independently read by me): Sinus bradycardia with  first-degree AV block.  PR interval 212 ms.  No ST segment changes.  October 2018 ECG (independently read by me): Sinus bradycardia at 52 bpm.  First-degree AV block with a PR interval of 214 ms.  Borderline voltage criteria for LVH.  QTc interval 403 ms.  No significant ST-T changes.  March 2018 ECG (independently read by me): Sinus bradycardia 57 bpm.  LVH by  voltage criteria.  Nonspecific ST changes.  Normal intervals.  August 2017 ECG (independently read by me): Normal sinus rhythm at 60 bpm.  LVH by voltage criteria.  Previous deep T-wave inversion inferiorly.  Is no longer present.  ECG (independently read by me): Sinus bradycardia 53 bpm with PAC.  New significant T-wave inversion in leads 3 and aVF which was not present on his April 2016 and prior 2015 ECGs.  April 2016 ECG (independently read by me): Sinus bradycardia 50 bpm.  Poor progression V1 through V3.  No significant ST segment changes  May 2015 ECG (independently read by me): Sinus bradycardia 53 beats per minute.  PR interval 176 ms, QTc interval normal at 470 ms.  No significant ST-T changes.  Prior December 2014 ECG: Sinus rhythm at 53 beats per minute. No ectopy. Normal intervals.  LABS:  BMP Latest Ref Rng & Units 12/15/2016 08/31/2015 01/11/2015  Glucose 65 - 99 mg/dL 92 78 87  BUN 8 - 27 mg/dL '16 21 17  ' Creatinine 0.76 - 1.27 mg/dL 1.46(H) 1.32(H) 1.23  BUN/Creat Ratio 10 - 24 11 - -  Sodium 134 - 144 mmol/L 141 141 141  Potassium 3.5 - 5.2 mmol/L 4.8 4.9 5.2  Chloride 96 - 106 mmol/L 101 104 103  CO2 18 - 29 mmol/L '25 26 29  ' Calcium 8.6 - 10.2 mg/dL 9.7 9.4 9.6   Hepatic Function Latest Ref Rng & Units 12/15/2016 01/11/2015  Total Protein 6.0 - 8.5 g/dL 6.8 7.1  Albumin 3.5 - 4.7 g/dL 4.0 4.3  AST 0 - 40 IU/L 20 20  ALT 0 - 44 IU/L 11 14  Alk Phosphatase 39 - 117 IU/L 42 38(L)  Total Bilirubin 0.0 - 1.2 mg/dL 0.6 0.8   CBC Latest Ref Rng & Units 12/15/2016 08/31/2015 01/11/2015  WBC 3.4 - 10.8 x10E3/uL 6.3 7.9 7.9    Hemoglobin 13.0 - 17.7 g/dL 13.5 13.2 14.6  Hematocrit 37.5 - 51.0 % 40.4 38.5(L) 42.4  Platelets 150 - 379 x10E3/uL 185 204 187   Lab Results  Component Value Date   MCV 97 12/15/2016   MCV 94.4 08/31/2015   MCV 94.6 01/11/2015   Lab Results  Component Value Date   TSH 8.950 (H) 07/02/2017   No results found for: HGBA1C   Lipid Panel     Component Value Date/Time   CHOL 146 12/15/2016 0907   TRIG 161 (H) 12/15/2016 0907   HDL 42 12/15/2016 0907   CHOLHDL 3.7 01/11/2015 1127   VLDL 36 01/11/2015 1127   LDLCALC 72 12/15/2016 0907   RADIOLOGY: No results found.  IMPRESSION:  1. CAD in native artery   2. Hx of CABG   3. Mild aortic stenosis   4. Hyperlipidemia with target LDL less than 70   5. Essential hypertension   6. Medication management   7. Hypothyroidism, unspecified type     ASSESSMENT AND PLAN: Douglas Bray is an active 84 year old gentleman who is status post CABG revascularization surgery In 1999.  His last nuclear perfusion study in April 2016 continued to show fairly normal perfusion without ischemia.  On his echo in August 2014 he had very mild aortic stenosis with  normal systolic function. In 2016 he was found to have deep T-wave inversion inferiorly and I performed right and left heart cardiac catheterization which revealed severe native CAD with total occlusion of the very proximal LAD after small septal perforating artery, total occlusion of a proximal left circumflex vessel after small OM  vessel, total occlusion of the proximal RCA.  His LIMA graft was widely patent as was the graft supplying the distal circumflex marginal with the previously placed stent in the midportion of the graft with intimal hyperplasia.  He had a patent vein graft to the diagonal vessel the previously placed stent that was widely patent.  Vein graft supplying the PDA remain patent.  He continues to be asymptomatic without recurrent anginal symptoms on medical therapy.   His blood pressure today is well controlled.  He is on isosorbide 90 mg daily, atenolol 12.5 mg, in addition to a supervision with quinapril 5 mg.  He has hyperlipidemia with target LDL less than 70 and has continued to be on simvastatin 20 mg daily.  He has history of hypothyroidism is now on levothyroxine at 50 mcg.  TSH was 4.2 on December 31, 2017.  Presently he is asymptomatic.  He has physical exam consistent with aortic valve stenosis.  His last echo Doppler study in March 2018 showed an EF of 55 to 60% with grade 2 diastolic dysfunction.  His peak aortic gradient was 22 with a mean gradient of 12.  Clinically he is doing well on current therapy.  I will see him in 6 months for reevaluation.  Prior to that office visit he will undergo an 68-monthfollow-up echo Doppler assessment of his aortic stenosis as well as systolic and diastolic function.  Repeat laboratory will be obtained prior to that office visit.  Time spent: 25 minutes  TTroy Sine MD, FThe [facility] 02/08/2018 9:28 AM

## 2018-02-08 NOTE — Patient Instructions (Signed)
Medication Instructions:  Your physician recommends that you continue on your current medications as directed. Please refer to the Current Medication list given to you today.  Labwork: Please return for FASTING labs in 6 months (CMET, CBC, Lipid, TSH)  Our in office lab hours are Monday-Friday 8:00-4:00, closed for lunch 12:45-1:45 pm.  No appointment needed.   Testing/Procedures: Your physician has requested that you have an echocardiogram in 6 MONTHS. Echocardiography is a painless test that uses sound waves to create images of your heart. It provides your doctor with information about the size and shape of your heart and how well your heart's chambers and valves are working. This procedure takes approximately one hour. There are no restrictions for this procedure.  This will be done at our [facility] location:  [place] wants you to follow-up in: 6 months with Dr. Claiborne Billings. You will receive a reminder letter in the mail two months in advance. If you don't receive a letter, please call our office to schedule the follow-up appointment.   Any Other Special Instructions Will Be Listed Below (If Applicable).     If you need a refill on your cardiac medications before your next appointment, please call your pharmacy.

## 2018-02-12 NOTE — Addendum Note (Signed)
Addended by: Leanord Asal T on: 02/12/2018 02:39 PM   Modules accepted: Orders

## 2018-03-18 ENCOUNTER — Other Ambulatory Visit: Payer: Self-pay | Admitting: Cardiovascular Disease

## 2018-04-03 DIAGNOSIS — E039 Hypothyroidism, unspecified: Secondary | ICD-10-CM | POA: Diagnosis not present

## 2018-04-03 DIAGNOSIS — I10 Essential (primary) hypertension: Secondary | ICD-10-CM | POA: Diagnosis not present

## 2018-04-03 DIAGNOSIS — L309 Dermatitis, unspecified: Secondary | ICD-10-CM | POA: Diagnosis not present

## 2018-07-02 ENCOUNTER — Other Ambulatory Visit: Payer: Self-pay | Admitting: Cardiovascular Disease

## 2018-07-11 ENCOUNTER — Other Ambulatory Visit: Payer: Self-pay | Admitting: *Deleted

## 2018-07-11 DIAGNOSIS — E785 Hyperlipidemia, unspecified: Secondary | ICD-10-CM

## 2018-07-11 DIAGNOSIS — I10 Essential (primary) hypertension: Secondary | ICD-10-CM

## 2018-07-11 DIAGNOSIS — Z79899 Other long term (current) drug therapy: Secondary | ICD-10-CM

## 2018-07-11 DIAGNOSIS — I2510 Atherosclerotic heart disease of native coronary artery without angina pectoris: Secondary | ICD-10-CM

## 2018-07-11 DIAGNOSIS — I350 Nonrheumatic aortic (valve) stenosis: Secondary | ICD-10-CM

## 2018-07-16 DIAGNOSIS — Z23 Encounter for immunization: Secondary | ICD-10-CM | POA: Diagnosis not present

## 2018-07-17 ENCOUNTER — Other Ambulatory Visit: Payer: Self-pay | Admitting: Cardiovascular Disease

## 2018-07-22 DIAGNOSIS — I350 Nonrheumatic aortic (valve) stenosis: Secondary | ICD-10-CM | POA: Diagnosis not present

## 2018-07-22 DIAGNOSIS — Z79899 Other long term (current) drug therapy: Secondary | ICD-10-CM | POA: Diagnosis not present

## 2018-07-22 DIAGNOSIS — E785 Hyperlipidemia, unspecified: Secondary | ICD-10-CM | POA: Diagnosis not present

## 2018-07-22 DIAGNOSIS — I2510 Atherosclerotic heart disease of native coronary artery without angina pectoris: Secondary | ICD-10-CM | POA: Diagnosis not present

## 2018-07-22 DIAGNOSIS — I10 Essential (primary) hypertension: Secondary | ICD-10-CM | POA: Diagnosis not present

## 2018-07-22 LAB — COMPREHENSIVE METABOLIC PANEL
A/G RATIO: 1.5 (ref 1.2–2.2)
ALT: 8 IU/L (ref 0–44)
AST: 14 IU/L (ref 0–40)
Albumin: 4 g/dL (ref 3.5–4.7)
Alkaline Phosphatase: 49 IU/L (ref 39–117)
BILIRUBIN TOTAL: 0.8 mg/dL (ref 0.0–1.2)
BUN/Creatinine Ratio: 11 (ref 10–24)
BUN: 13 mg/dL (ref 8–27)
CALCIUM: 9.2 mg/dL (ref 8.6–10.2)
CHLORIDE: 104 mmol/L (ref 96–106)
CO2: 26 mmol/L (ref 20–29)
Creatinine, Ser: 1.21 mg/dL (ref 0.76–1.27)
GFR calc Af Amer: 62 mL/min/{1.73_m2} (ref >59)
GFR calc non Af Amer: 54 mL/min/{1.73_m2} — ABNORMAL LOW (ref >59)
Globulin, Total: 2.6 g/dL (ref 1.5–4.5)
Glucose: 100 mg/dL — ABNORMAL HIGH (ref 65–99)
POTASSIUM: 4.6 mmol/L (ref 3.5–5.2)
Sodium: 141 mmol/L (ref 134–144)
Total Protein: 6.6 g/dL (ref 6.0–8.5)

## 2018-07-22 LAB — CBC
HEMOGLOBIN: 12.3 g/dL — AB (ref 13.0–17.7)
Hematocrit: 35.9 % — ABNORMAL LOW (ref 37.5–51.0)
MCH: 32.4 pg (ref 26.6–33.0)
MCHC: 34.3 g/dL (ref 31.5–35.7)
MCV: 95 fL (ref 79–97)
Platelets: 165 10*3/uL (ref 150–450)
RBC: 3.8 x10E6/uL — AB (ref 4.14–5.80)
RDW: 13.5 % (ref 12.3–15.4)
WBC: 6.8 10*3/uL (ref 3.4–10.8)

## 2018-07-23 LAB — LIPID PANEL
Chol/HDL Ratio: 2.9 ratio (ref 0.0–5.0)
Cholesterol, Total: 110 mg/dL (ref 100–199)
HDL: 38 mg/dL — ABNORMAL LOW (ref >39)
LDL Calculated: 51 mg/dL (ref 0–99)
Triglycerides: 104 mg/dL (ref 0–149)
VLDL Cholesterol Cal: 21 mg/dL (ref 5–40)

## 2018-07-23 LAB — TSH: TSH: 5.26 u[IU]/mL — AB (ref 0.450–4.500)

## 2018-08-01 DIAGNOSIS — G8929 Other chronic pain: Secondary | ICD-10-CM | POA: Diagnosis not present

## 2018-08-01 DIAGNOSIS — M1712 Unilateral primary osteoarthritis, left knee: Secondary | ICD-10-CM | POA: Diagnosis not present

## 2018-08-01 DIAGNOSIS — M25562 Pain in left knee: Secondary | ICD-10-CM | POA: Diagnosis not present

## 2018-08-12 ENCOUNTER — Other Ambulatory Visit: Payer: Self-pay

## 2018-08-12 ENCOUNTER — Ambulatory Visit (HOSPITAL_COMMUNITY): Payer: Medicare Other | Attending: Cardiovascular Disease

## 2018-08-12 DIAGNOSIS — I2510 Atherosclerotic heart disease of native coronary artery without angina pectoris: Secondary | ICD-10-CM | POA: Diagnosis not present

## 2018-08-12 DIAGNOSIS — I350 Nonrheumatic aortic (valve) stenosis: Secondary | ICD-10-CM

## 2018-09-24 ENCOUNTER — Encounter: Payer: Self-pay | Admitting: Cardiovascular Disease

## 2018-09-24 ENCOUNTER — Ambulatory Visit: Payer: Medicare Other | Admitting: Cardiovascular Disease

## 2018-09-24 VITALS — BP 118/72 | HR 76 | Ht 68.0 in | Wt 167.2 lb

## 2018-09-24 DIAGNOSIS — E039 Hypothyroidism, unspecified: Secondary | ICD-10-CM

## 2018-09-24 DIAGNOSIS — E785 Hyperlipidemia, unspecified: Secondary | ICD-10-CM

## 2018-09-24 DIAGNOSIS — I2510 Atherosclerotic heart disease of native coronary artery without angina pectoris: Secondary | ICD-10-CM

## 2018-09-24 DIAGNOSIS — Z951 Presence of aortocoronary bypass graft: Secondary | ICD-10-CM

## 2018-09-24 DIAGNOSIS — Z7901 Long term (current) use of anticoagulants: Secondary | ICD-10-CM

## 2018-09-24 DIAGNOSIS — I4891 Unspecified atrial fibrillation: Secondary | ICD-10-CM | POA: Diagnosis not present

## 2018-09-24 DIAGNOSIS — I10 Essential (primary) hypertension: Secondary | ICD-10-CM

## 2018-09-24 DIAGNOSIS — I350 Nonrheumatic aortic (valve) stenosis: Secondary | ICD-10-CM

## 2018-09-24 DIAGNOSIS — I2583 Coronary atherosclerosis due to lipid rich plaque: Secondary | ICD-10-CM | POA: Diagnosis not present

## 2018-09-24 DIAGNOSIS — Z5181 Encounter for therapeutic drug level monitoring: Secondary | ICD-10-CM

## 2018-09-24 MED ORDER — APIXABAN 5 MG PO TABS: 5.0000 mg | ORAL_TABLET | Freq: Two times a day (BID) | ORAL | 0 refills | Status: DC

## 2018-09-24 MED ORDER — AMIODARONE HCL 200 MG PO TABS: 200.0000 mg | ORAL_TABLET | Freq: Every day | ORAL | 6 refills | Status: DC

## 2018-09-24 MED ORDER — APIXABAN 5 MG PO TABS: 5.0000 mg | ORAL_TABLET | Freq: Two times a day (BID) | ORAL | 6 refills | Status: DC

## 2018-09-24 NOTE — Progress Notes (Signed)
Patient ID: Douglas Bray, male   DOB: [birth year], 84 y.o.   MRN: [id number]     HPI: Douglas Bray is a 84 y.o. male who presents for an 7 month cardiology evaluation.  Douglas Bray has known CAD and in April 1999 underwent CABG revascularization surgery LIMA to the LAD, vein to the diagonal, vein to the marginal, and vein to the PDA as well as optional diagonal vessel. In April 2002 he underwent complex 2 vessel intervention to the diagonal vessel with stenting as well as stenting to the circumflex graft. In January 2012 echo Doppler study suggested normal systolic function with diastolic dysfunction. He had moderate to severe tricuspid regurgitation with mild pulmonary hypertension, mild aortic insufficiency, mild mitral regurgitation.  In May 2013 due to episodes of left arm discomfort a nuclear perfusion study was performed which continued to show normal perfusion.  An echo Doppler study on 05/14/2013  revealed mild LVH with normal systolic function. Diastolic parameters were normal. There was very mild aortic stenosis with mildly thickened trileaflet valve with mild AR. He had mild mitral regurgitation, moderately severe tricuspid regurgitation and mild pulmonary hypertension with a PA pressure 37 mm. His mean aortic gradient was 7 with a peak gradient of 16.  Additional problems include hypertension and hyperlipidemia.  Since I last saw him, he underwent a 2-D echo Doppler study in April 2016 which showed an ejection fraction of 55-60% with mild concentric LVH and grade 2 diastolic dysfunction.  His mean gradient across his aortic valve was 9 with a peak gradient of 17 and his aortic valve area range from 1.7-2.04 cm.  A nuclear perfusion study revealed normal perfusion.  In 2016 he was working hard and typically 12-14 hours a day.  He admits to increasing fatigue but denies chest pain or shortness of breath.  He is not resting well.  He is unaware of palpitations.  He denies PND,  orthopnea.  When I last saw him, he had developed new deep T-wave inversion inferiorly, which had not been present April 2016 at the time of his stress test.  Plavix was added to his medical rate regimen and nitrates were increased.  On 08/29/2015.  He underwent right and left heart cardiac catheterization.  He normal LV function with an EF of 55%.  His right heart pressures were normal.  There was very minimal aortic valve stenosis.  He has severe native CAD with total occlusion of the very proximal LAD after a small septal perforating artery, total occlusion of the very proximal left circumflex vessel after small OM vessel and total occlusion of the proximal RCA.  He is a patent LIMA graft supplying the mid LAD.  There was a pain, vein graft supplying the distal circumflex marginal with the previously placed stent in the midportion of the graft with intimal hyperplasia.  Narrowing of 30-40% and 50% somewhat eccentric narrowing in the more distal portion of the graft.  He had septal collaterals to the proximal LAD from the distal OM branch.  Vein graft which probably had previously supplied the optional diagonal vessel.  There was a patent vein graft to the diagonal vessel, which with previously placed stent that is widely patent.  In the midportion of the graft.  The vein graft supplying the PDA and a dominant RCA system was patent.  Medical therapy was recommended.  [place] after his wife of 33 years, 2 months and 15 days had passed away.  He 08-16-23  2017, he re-married his wife's best friend.  When I last saw him, I recommended he undergo a follow-up echo Doppler study.  As was done on 01/05/2017 and essentially was unchanged from previously.  He had normal EF at 55-60%, mild LVH, grade 2 diastolic dysfunction, and mild aortic valve stenosis with mild AR.  There was mild LA dilatation and mild pulmonary hypertension.  His mean aortic gradient was 12 mm with a peak gradient of 22  mm.  He had follow-up laboratory which reconfirmed mild TSH elevation which had increased 8.95.  I called in levothyroxine initially at 25 g which was titrated to 50 ug.  When I saw him in May 2019 he was doing well and denied any chest pain, PND orthopnea, presyncope or syncope.   He had some arthritic issues.  Laboratory in October 2018 showed a total cholesterol 128, LDL 59.  Triglycerides are 158.  He had normal renal function.    Over the past 6 months, he has not been as active as he had in the past.  He denies any episodes of chest pressure.  He denies any episodes of tachycardia.  He had undergone an echo Doppler study in November 2019 which showed normal LV function with an EF of 55 to 60%.  His aortic stenosis had progressed and was now felt to be moderate with a mean gradient of 12, peak gradient of 22, and valve area of 1.1 cm.  He denies any presyncope or syncope.  Presents for reevaluation.  Allergies  Allergen Reactions  . Ramipril Cough    Current Outpatient Medications  Medication Sig Dispense Refill  . aspirin EC 81 MG tablet Take 81 mg by mouth daily.    Marland Kitchen atenolol (TENORMIN) 25 MG tablet TAKE ONE-HALF TABLET DAILY 45 tablet 10  . clopidogrel (PLAVIX) 75 MG tablet TAKE ONE (1) TABLET ONCE DAILY 90 tablet 3  . Fish Oil OIL Take 1 capsule by mouth 2 (two) times daily.    . isosorbide mononitrate (IMDUR) 60 MG 24 hr tablet TAKE 1 & 1/2 TABLET BY MOUTH DAILY 45 tablet 8  . levothyroxine (SYNTHROID, LEVOTHROID) 50 MCG tablet Take 1 tablet (50 mcg total) by mouth every morning. 90 tablet 1  . Methylcobalamin (B12-ACTIVE PO) Take 1 tablet by mouth daily.    . naproxen sodium (ANAPROX) 220 MG tablet Take 220 mg by mouth 2 (two) times daily as needed (pain).    . nitroGLYCERIN (NITROSTAT) 0.4 MG SL tablet Place 1 tablet (0.4 mg total) under the tongue every 5 (five) minutes as needed for chest pain (x 3 doses). 30 tablet 1  . quinapril (ACCUPRIL) 5 MG tablet TAKE ONE (1) TABLET ONCE  DAILY 90 tablet 3  . simvastatin (ZOCOR) 20 MG tablet Take 1 tablet by mouth every evening.    . simvastatin (ZOCOR) 40 MG tablet TAKE ONE (1) TABLET ONCE DAILY 90 tablet 0  . amiodarone (PACERONE) 200 MG tablet Take 1 tablet (200 mg total) by mouth daily. 30 tablet 6  . apixaban (ELIQUIS) 5 MG TABS tablet Take 1 tablet (5 mg total) by mouth 2 (two) times daily. 60 tablet 6  . apixaban (ELIQUIS) 5 MG TABS tablet Take 1 tablet (5 mg total) by mouth 2 (two) times daily. 60 tablet 0   No current facility-administered medications for this visit.     Social History   Socioeconomic History  . Marital status: Widowed    Spouse name: Not on file  . Number of children:  Not on file  . Years of education: Not on file  . Highest education level: Not on file  Occupational History  . Not on file  Social Needs  . Financial resource strain: Not on file  . Food insecurity:    Worry: Not on file    Inability: Not on file  . Transportation needs:    Medical: Not on file    Non-medical: Not on file  Tobacco Use  . Smoking status: Former Research scientist (life sciences)  . Smokeless tobacco: Current User    Types: Chew  . Tobacco comment: quit smoking 50 years ago. chews occas.  Substance and Sexual Activity  . Alcohol use: No  . Drug use: No  . Sexual activity: Not on file  Lifestyle  . Physical activity:    Days per week: Not on file    Minutes per session: Not on file  . Stress: Not on file  Relationships  . Social connections:    Talks on phone: Not on file    Gets together: Not on file    Attends religious service: Not on file    Active member of club or organization: Not on file    Attends meetings of clubs or organizations: Not on file    Relationship status: Not on file  . Intimate partner violence:    Fear of current or ex partner: Not on file    Emotionally abused: Not on file    Physically abused: Not on file    Forced sexual activity: Not on file  Other Topics Concern  . Not on file  Social  History Narrative  . Not on file   Social history is notable in that he is widowedand re-married;  has 2 children, one deceased, 5 grandchildren 8 great-grandchildren. There is no call use. He previously chewed tobacco. His wife did suffer a CVA.  No family history on file.  Both parents are deceased.  He has 4 living brothers and one deceased.  ROS General: Negative; No fevers, chills, or night sweats;  HEENT: Negative; No changes in vision or hearing, sinus congestion, difficulty swallowing Pulmonary: Negative; No cough, wheezing, shortness of breath, hemoptysis Cardiovascular: Negative; No chest pain, presyncope, syncope, palpatations GI: Negative; No nausea, vomiting, diarrhea, or abdominal pain GU: Negative; No dysuria, hematuria, or difficulty voiding Musculoskeletal: Positive for bilateral knee discomfort Hematologic/Oncology: Negative; no easy bruising, bleeding Endocrine: Negative; no heat/cold intolerance; no diabetes Neuro: Negative; no changes in balance, headaches Skin: Negative; No rashes or skin lesions Psychiatric: Negative; No behavioral problems, depression Sleep: Negative; No snoring, daytime sleepiness, hypersomnolence, bruxism, restless legs, hypnogognic hallucinations, no cataplexy Other comprehensive 14 point system review is negative.   PE BP 118/72   Pulse 76   Ht '5\' 8"'  (1.727 m)   Wt 167 lb 3.2 oz (75.8 kg)   BMI 25.42 kg/m    Repeat blood pressure by me was 122/74.  Wt Readings from Last 3 Encounters:  09/24/18 167 lb 3.2 oz (75.8 kg)  02/08/18 168 lb (76.2 kg)  07/18/17 171 lb 12.8 oz (77.9 kg)   General: Alert, oriented, no distress.  Skin: normal turgor, no rashes, warm and dry HEENT: Normocephalic, atraumatic. Pupils equal round and reactive to light; sclera anicteric; extraocular muscles intact;  Nose without nasal septal hypertrophy Mouth/Parynx benign; Mallinpatti scale Neck: No JVD, no carotid bruits; normal carotid upstroke Lungs:  clear to ausculatation and percussion; no wheezing or rales Chest wall: without tenderness to palpitation Heart: PMI not displaced, regularly irregular with  a controlled ventricular rate in the 60s, s1 s2 normal, 2/6 systolic murmur in the aortic area, no diastolic murmur, no rubs, gallops, thrills, or heaves Abdomen: soft, nontender; no hepatosplenomehaly, BS+; abdominal aorta nontender and not dilated by palpation. Back: no CVA tenderness Pulses 2+ Musculoskeletal: full range of motion, normal strength, no joint deformities Extremities: no clubbing cyanosis or edema, Homan's sign negative  Neurologic: grossly nonfocal; Cranial nerves grossly wnl Psychologic: Normal mood and affect   ECG (independently read by me): Atrial fibrillation with a ventricular rate in the 70s.  Poor anterior R wave progression.  QTc interval 436 ms.  May 2019 ECG (independently read by me): Sinus bradycardia with first-degree AV block.  PR interval 212 ms.  No ST segment changes.  October 2018 ECG (independently read by me): Sinus bradycardia at 52 bpm.  First-degree AV block with a PR interval of 214 ms.  Borderline voltage criteria for LVH.  QTc interval 403 ms.  No significant ST-T changes.  March 2018 ECG (independently read by me): Sinus bradycardia 57 bpm.  LVH by voltage criteria.  Nonspecific ST changes.  Normal intervals.  August 2017 ECG (independently read by me): Normal sinus rhythm at 60 bpm.  LVH by voltage criteria.  Previous deep T-wave inversion inferiorly.  Is no longer present.  ECG (independently read by me): Sinus bradycardia 53 bpm with PAC.  New significant T-wave inversion in leads 3 and aVF which was not present on his April 2016 and prior 2015 ECGs.  April 2016 ECG (independently read by me): Sinus bradycardia 50 bpm.  Poor progression V1 through V3.  No significant ST segment changes  May 2015 ECG (independently read by me): Sinus bradycardia 53 beats per minute.  PR interval 176 ms,  QTc interval normal at 470 ms.  No significant ST-T changes.  Prior December 2014 ECG: Sinus rhythm at 53 beats per minute. No ectopy. Normal intervals.  LABS:  BMP Latest Ref Rng & Units 07/22/2018 12/15/2016 08/31/2015  Glucose 65 - 99 mg/dL 100(H) 92 78  BUN 8 - 27 mg/dL '13 16 21  ' Creatinine 0.76 - 1.27 mg/dL 1.21 1.46(H) 1.32(H)  BUN/Creat Ratio 10 - '24 11 11 ' -  Sodium 134 - 144 mmol/L 141 141 141  Potassium 3.5 - 5.2 mmol/L 4.6 4.8 4.9  Chloride 96 - 106 mmol/L 104 101 104  CO2 20 - 29 mmol/L '26 25 26  ' Calcium 8.6 - 10.2 mg/dL 9.2 9.7 9.4   Hepatic Function Latest Ref Rng & Units 07/22/2018 12/15/2016 01/11/2015  Total Protein 6.0 - 8.5 g/dL 6.6 6.8 7.1  Albumin 3.5 - 4.7 g/dL 4.0 4.0 4.3  AST 0 - 40 IU/L '14 20 20  ' ALT 0 - 44 IU/L '8 11 14  ' Alk Phosphatase 39 - 117 IU/L 49 42 38(L)  Total Bilirubin 0.0 - 1.2 mg/dL 0.8 0.6 0.8   CBC Latest Ref Rng & Units 07/22/2018 12/15/2016 08/31/2015  WBC 3.4 - 10.8 x10E3/uL 6.8 6.3 7.9  Hemoglobin 13.0 - 17.7 g/dL 12.3(L) 13.5 13.2  Hematocrit 37.5 - 51.0 % 35.9(L) 40.4 38.5(L)  Platelets 150 - 450 x10E3/uL 165 185 204   Lab Results  Component Value Date   MCV 95 07/22/2018   MCV 97 12/15/2016   MCV 94.4 08/31/2015   Lab Results  Component Value Date   TSH 5.260 (H) 07/22/2018   No results found for: HGBA1C   Lipid Panel     Component Value Date/Time   CHOL 110 07/22/2018 0933  TRIG 104 07/22/2018 0933   HDL 38 (L) 07/22/2018 0933   CHOLHDL 2.9 07/22/2018 0933   CHOLHDL 3.7 01/11/2015 1127   VLDL 36 01/11/2015 1127   LDLCALC 51 07/22/2018 0933   RADIOLOGY: No results found.  IMPRESSION:  1. Coronary artery disease due to lipid rich plaque   2. Hx of CABG   3. New onset a-fib ([place])   4. CAD in native artery   5. Moderate aortic stenosis   6. Hyperlipidemia with target LDL less than 70   7. Hypothyroidism, unspecified type   8. Essential hypertension   9. Alteration in anticoagulation     ASSESSMENT AND PLAN: Mr.  BLAYDE BACIGALUPI is an active 84 year old gentleman who is status post CABG revascularization surgery In 1999.  His last nuclear perfusion study in April 2016 continued to show fairly normal perfusion without ischemia.  On his echo in August 2014 he had very mild aortic stenosis with  normal systolic function. In 2016 he was found to have deep T-wave inversion inferiorly and I performed right and left heart cardiac catheterization which revealed severe native CAD with total occlusion of the very proximal LAD after small septal perforating artery, total occlusion of a proximal left circumflex vessel after small OM vessel, total occlusion of the proximal RCA.  His LIMA graft was widely patent as was the graft supplying the distal circumflex marginal with the previously placed stent in the midportion of the graft with intimal hyperplasia.  He had a patent vein graft to the diagonal vessel the previously placed stent that was widely patent.  Vein graft supplying the PDA remain patent.  He continues to be asymptomatic without recurrent anginal symptoms on medical therapy.  I reviewed his most recent echo Doppler study from November 2019 which showed an EF of 55 to 60%.  He again had a mean gradient of 12, peak gradient of 22, si,milar to his March 2018 study.  On his November 2019 study, aortic valve area was 1.1 cm suggestive of moderate aortic stenosis.  His blood pressure today is controlled on his regimen consisting of atenolol 12.5 mg daily, isosorbide 90 mg, quinapril 5 mg daily.  His ECG today, however, shows atrial fibrillation.  He is unaware of this arrhythmia.  I have recommended that he discontinue Plavix and will start him on Eliquis 5 mg twice a day.  With his CAD stents in the vein grafts I have suggested he continue aspirin 81 mg.  He has not had bleeding issues in the past.  His LA dimension was 45 mm on most recent echo.  I am initiating amiodarone 200 mg daily and attempt to restore sinus rhythm.   He will monitor his heart rate.  He is on simvastatin 20 mg for hyperlipidemia.  Most recent LDL cholesterol was 51.  I will see him in 6 weeks for follow-up evaluation or sooner if needed.  Time Spent: 25 minutes  Troy Sine, MD, [facility]  09/24/2018 7:34 PM

## 2018-09-24 NOTE — Patient Instructions (Addendum)
Medication Instructions:   Start  taking ELIQUIS 5 MG one tablet twice a day   START TAKING AMIODARONE 200 MG  ONE TABLET DAILY   stop taking clopidogrel (plavix)   Continue taking ASPIRIN 81 MG  One tablet daily  If you need a refill on your cardiac medications before your next appointment, please call your pharmacy.   Lab work: NOT NEEDED If you have labs (blood work) drawn today and your tests are completely normal, you will receive your results only by: Marland Kitchen MyChart Message (if you have MyChart) OR . A paper copy in the mail If you have any lab test that is abnormal or we need to change your treatment, we will call you to review the results.  Testing/Procedures: Not needed  Follow-Up: At [facility], you and your health needs are our priority.  As part of our continuing mission to provide you with exceptional heart care, we have created designated Provider Care Teams.  These Care Teams include your primary Cardiologist (physician) and Advanced Practice Providers (APPs -  Physician Assistants and Nurse Practitioners) who all work together to provide you with the care you need, when you need it. You will need a follow up appointment in 6  TO 8 WEEKS .  Please call our office 2 months in advance to schedule this appointment.  You may see Shelva Majestic, MD  or one of the following Advanced Practice Providers on your designated Care Team: [place], [place] . Fabian Sharp, PA-C  Any Other Special Instructions Will Be Listed Below (If Applicable).

## 2018-09-25 ENCOUNTER — Telehealth: Payer: Self-pay | Admitting: Cardiovascular Disease

## 2018-09-30 NOTE — Telephone Encounter (Signed)
ok 

## 2018-09-30 NOTE — Telephone Encounter (Signed)
rx refilled.

## 2018-10-08 ENCOUNTER — Encounter: Payer: Self-pay | Admitting: Adult Health

## 2018-10-08 DIAGNOSIS — I5032 Chronic diastolic (congestive) heart failure: Secondary | ICD-10-CM | POA: Diagnosis not present

## 2018-10-08 DIAGNOSIS — N183 Chronic kidney disease, stage 3 (moderate): Secondary | ICD-10-CM | POA: Diagnosis not present

## 2018-10-08 DIAGNOSIS — R4020 Unspecified coma: Secondary | ICD-10-CM | POA: Diagnosis not present

## 2018-10-08 DIAGNOSIS — I350 Nonrheumatic aortic (valve) stenosis: Secondary | ICD-10-CM | POA: Diagnosis not present

## 2018-10-08 DIAGNOSIS — S0101XA Laceration without foreign body of scalp, initial encounter: Secondary | ICD-10-CM | POA: Diagnosis not present

## 2018-10-08 DIAGNOSIS — Z7982 Long term (current) use of aspirin: Secondary | ICD-10-CM | POA: Diagnosis not present

## 2018-10-08 DIAGNOSIS — J9811 Atelectasis: Secondary | ICD-10-CM | POA: Diagnosis not present

## 2018-10-08 DIAGNOSIS — I2510 Atherosclerotic heart disease of native coronary artery without angina pectoris: Secondary | ICD-10-CM | POA: Diagnosis not present

## 2018-10-08 DIAGNOSIS — I130 Hypertensive heart and chronic kidney disease with heart failure and stage 1 through stage 4 chronic kidney disease, or unspecified chronic kidney disease: Secondary | ICD-10-CM | POA: Diagnosis not present

## 2018-10-08 DIAGNOSIS — I10 Essential (primary) hypertension: Secondary | ICD-10-CM | POA: Diagnosis not present

## 2018-10-08 DIAGNOSIS — S060X9A Concussion with loss of consciousness of unspecified duration, initial encounter: Secondary | ICD-10-CM | POA: Diagnosis not present

## 2018-10-08 DIAGNOSIS — I4891 Unspecified atrial fibrillation: Secondary | ICD-10-CM | POA: Diagnosis not present

## 2018-10-08 DIAGNOSIS — Z951 Presence of aortocoronary bypass graft: Secondary | ICD-10-CM | POA: Diagnosis not present

## 2018-10-08 DIAGNOSIS — R58 Hemorrhage, not elsewhere classified: Secondary | ICD-10-CM | POA: Diagnosis not present

## 2018-10-08 DIAGNOSIS — I959 Hypotension, unspecified: Secondary | ICD-10-CM | POA: Diagnosis not present

## 2018-10-08 DIAGNOSIS — R001 Bradycardia, unspecified: Secondary | ICD-10-CM | POA: Diagnosis not present

## 2018-10-08 DIAGNOSIS — R55 Syncope and collapse: Secondary | ICD-10-CM

## 2018-10-08 DIAGNOSIS — Z7901 Long term (current) use of anticoagulants: Secondary | ICD-10-CM | POA: Diagnosis not present

## 2018-10-08 DIAGNOSIS — W19XXXA Unspecified fall, initial encounter: Secondary | ICD-10-CM | POA: Diagnosis not present

## 2018-10-08 DIAGNOSIS — S0003XA Contusion of scalp, initial encounter: Secondary | ICD-10-CM | POA: Diagnosis not present

## 2018-10-08 DIAGNOSIS — Z79899 Other long term (current) drug therapy: Secondary | ICD-10-CM | POA: Diagnosis not present

## 2018-10-09 ENCOUNTER — Encounter: Payer: Self-pay | Admitting: Adult Health

## 2018-10-09 DIAGNOSIS — I10 Essential (primary) hypertension: Secondary | ICD-10-CM | POA: Diagnosis not present

## 2018-10-09 DIAGNOSIS — R55 Syncope and collapse: Secondary | ICD-10-CM | POA: Diagnosis not present

## 2018-10-09 DIAGNOSIS — I4891 Unspecified atrial fibrillation: Secondary | ICD-10-CM | POA: Diagnosis not present

## 2018-10-09 DIAGNOSIS — I2510 Atherosclerotic heart disease of native coronary artery without angina pectoris: Secondary | ICD-10-CM | POA: Diagnosis not present

## 2018-10-09 DIAGNOSIS — S060X9A Concussion with loss of consciousness of unspecified duration, initial encounter: Secondary | ICD-10-CM | POA: Diagnosis not present

## 2018-10-09 DIAGNOSIS — I350 Nonrheumatic aortic (valve) stenosis: Secondary | ICD-10-CM | POA: Diagnosis not present

## 2018-10-10 ENCOUNTER — Telehealth: Payer: Self-pay | Admitting: Cardiovascular Disease

## 2018-10-10 NOTE — Telephone Encounter (Signed)
Spoke with pt wife. Mrs. mckade gurka that the pt had an syncope episode on 10/08/18 while at his Dermatologist. Pt hit his head, he has a concussion and had a laceration that required stitches. Pt was hospitalized at [facility] and d/c on 10/09/18. They were told by the hospitalist that the pt needs to be seen by [facility] sooner than his scheduled appt on 12/02/18.  Adv pt wife that Dr.Kelly doesn't have anything available soon. Offered them an earlier appt with an APP. Appt scheduled Jory Sims, DNP on 10/14/18 @11am . Instructed them to go back to the ED if pt develops symptoms or any reoccurrence of syncope. Mrs. Checketts verbalizes understanding and voiced appreciation for the assistance.

## 2018-10-10 NOTE — Telephone Encounter (Signed)
New Message       Patient wife is calling today to see if  Her husband can be seen sooner due to him passing out?

## 2018-10-12 NOTE — Progress Notes (Signed)
Cardiology Office Note   Date:  10/14/2018   ID:  Douglas Bray, DOB [birth year], MRN [id number]  PCP:  Garwin Brothers, MD  Cardiologist: Dr. Claiborne Billings Chief Complaint  Patient presents with  . Follow-up  . Coronary Artery Disease  . Atrial Fibrillation  . Loss of Consciousness     History of Present Illness: Douglas Bray is a 85 y.o. male who presents for ongoing assessment and management of CAD, with hx of CABG (LIMA to LAD, SVG to diagonal, SVG to the marginal, and SVG to PDA. He has had complex 2 vessel PCI to the diagonal vessel with stenting, and PCI to the Cx graft. He has severe TR and mild pulmonary HTN. Other hx of HTN, and HL.   Most recent cardiac cath was completed on 08/2015 revealed severe native CAD with total occlusion of the very proximal LAD after a small septal perforating artery, total occlusion of the very proximal left circumflex vessel after small OM vessel and total occlusion of the proximal RCA.  He is a patent LIMA graft supplying the mid LAD.  There was a patent vein graft supplying the distal circumflex marginal with the previously placed stent in the midportion of the graft with intimal hyperplasia.  Narrowing of 30-40% and 50% somewhat eccentric narrowing in the more distal portion of the graft.  He had septal collaterals to the proximal LAD from the distal OM branch.  Vein graft which probably had previously supplied the optional diagonal vessel.  There was a patent vein graft to the diagonal vessel, which with previously placed stent that is widely patent.  In the midportion of the graft.  The vein graft supplying the PDA and a dominant RCA system was patent.  Medical therapy was recommended.  He was also found to have elevated TSH in 2018 with level of 8.95. He was increased on levothyroxine to 50 . He was last seen on 09/24/2018, by Dr. Claiborne Billings. He was found to be in atrial fibrillation. Plavix was discontinued and he was started on Eliquis 5 mg BID. He was  continued on ASA. Amiodarone was started on 200 mg for rate control and hopefully to have pharmacologic cardioversion.   He called our office on 10/10/2018 reporting a syncopal episode while walking in the parking lot to see his  Dermatologist.  He hit his head and sustained a laceration and a concussion. He was hospitalized at [facility] for one day. He is here for post hospital follow up. He reports that his isosorbide was stopped by the providers at [facility].   He states he has not had any further episodes, no prior symptoms of dizziness or near syncope. He denies pain or DOE since being released. He has no neuro defects or memory loss.   Past Medical History:  Diagnosis Date  . CAD (coronary artery disease)   . Dyslipidemia   . Hypertension     Past Surgical History:  Procedure Laterality Date  . CARDIAC CATHETERIZATION N/A 09/03/2015   Procedure: Right/Left Heart Cath and Coronary/Graft Angiography;  Surgeon: Troy Sine, MD;  Location: [place] [facility];  Service: Cardiovascular;  Laterality: N/A;  . CORONARY ARTERY BYPASS GRAFT  02/01/1998   LIMA to LAD,SVG to diagonal,SVG to marginal, SVG to PDA  . KNEE SURGERY  1986  . NM MYOCAR PERF WALL MOTION  03/08/2012   old septal infarct, no reversible ischemia, PAF w/RVR w/symptomatic hypotension.  Marland Kitchen US ECHOCARDIOGRAPHY  10/19/2010   mod to severe TR,mild  AI     Current Outpatient Medications  Medication Sig Dispense Refill  . amiodarone (PACERONE) 200 MG tablet Take 1.5 tablets (300 mg total) by mouth daily. 30 tablet 6  . apixaban (ELIQUIS) 5 MG TABS tablet Take 1 tablet (5 mg total) by mouth 2 (two) times daily. 60 tablet 6  . aspirin EC 81 MG tablet Take 81 mg by mouth daily.    Marland Kitchen atenolol (TENORMIN) 25 MG tablet TAKE ONE-HALF TABLET DAILY 45 tablet 10  . Fish Oil OIL Take 1 capsule by mouth 2 (two) times daily.    Marland Kitchen levothyroxine (SYNTHROID, LEVOTHROID) 50 MCG tablet TAKE ONE TABLET EVERY MORNING 90 tablet 1  .  Methylcobalamin (B12-ACTIVE PO) Take 1 tablet by mouth daily.    . naproxen sodium (ANAPROX) 220 MG tablet Take 220 mg by mouth 2 (two) times daily as needed (pain).    . nitroGLYCERIN (NITROSTAT) 0.4 MG SL tablet Place 1 tablet (0.4 mg total) under the tongue every 5 (five) minutes as needed for chest pain (x 3 doses). 30 tablet 1  . simvastatin (ZOCOR) 20 MG tablet Take 1 tablet by mouth every evening.     No current facility-administered medications for this visit.     Allergies:   Ramipril    Social History:  The patient  reports that he has quit smoking. His smokeless tobacco use includes chew. He reports that he does not drink alcohol or use drugs.   Family History:  The patient's family history is not on file.    ROS: All other systems are reviewed and negative. Unless otherwise mentioned in H&P    PHYSICAL EXAM: VS:  BP 128/70   Pulse 73   Ht 5\' 8"  (1.727 m)   Wt 164 lb (74.4 kg)   BMI 24.94 kg/m  , BMI Body mass index is 24.94 kg/m. GEN: Well nourished, well developed, in no acute distress HEENT: normal. Ecchymosis with laceration of the left parietal location with dried blood and nylon stiches. Left eye periorbital ecchymosis with mild hematoma.  Neck: no JVD, carotid bruits, or masses Cardiac: IRRR; 1/6 systolic  murmurs, rubs, or gallops,no edema  Respiratory:  Clear to auscultation bilaterally, normal work of breathing GI: soft, nontender, nondistended, + BS MS: no deformity or atrophy. Left knee laceration on the patella area.  Skin: warm and dry, no rash Neuro:  Strength and sensation are intact Psych: euthymic mood, full affect   EKG:  Atrial fibrillation rate of 73 bpm.   Recent Labs: 07/22/2018: ALT 8; BUN 13; Creatinine, Ser 1.21; Hemoglobin 12.3; Platelets 165; Potassium 4.6; Sodium 141; TSH 5.260    Lipid Panel    Component Value Date/Time   CHOL 110 07/22/2018 0933   TRIG 104 07/22/2018 0933   HDL 38 (L) 07/22/2018 0933   CHOLHDL 2.9 07/22/2018  0933   CHOLHDL 3.7 01/11/2015 1127   VLDL 36 01/11/2015 1127   LDLCALC 51 07/22/2018 0933      Wt Readings from Last 3 Encounters:  10/14/18 164 lb (74.4 kg)  09/24/18 167 lb 3.2 oz (75.8 kg)  02/08/18 168 lb (76.2 kg)      Other studies Reviewed: Echocardiogram 08/28/18 Left ventricle: The cavity size was normal. Wall thickness was   increased in a pattern of moderate LVH. Systolic function was   normal. The estimated ejection fraction was in the range of 55%   to 60%. Basal septal hypokinesis. The study is not technically   sufficient to allow evaluation of LV  diastolic function. - Aortic valve: Calcified with moderate stenosis. Trivial   regurgitation. Mean gradient (S): 12 mm Hg. Peak gradient (S): 22   mm Hg. Valve area (VTI): 0.93 cm^2. Valve area (Vmax): 1 cm^2.   Valve area (Vmean): 1.04 cm^2. - Mitral valve: Mildly thickened leaflets . There was mild   regurgitation. - Left atrium: Moderately dilated. - Right ventricle: The cavity size was moderately dilated. Moderate   hypokinesis. - Right atrium: The atrium was mildly dilated. - Tricuspid valve: There was moderate regurgitation. - Pulmonary arteries: PA peak pressure: 28 mm Hg (S). - Inferior vena cava: The vessel was normal in size. The   respirophasic diameter changes were in the normal range (= 50%),   consistent with normal central venous pressure.  Conclusion    Mid Cx lesion, 100% stenosed.  Prox LAD lesion, 100% stenosed.  Prox RCA lesion, 100% stenosed.  LIMA was injected is normal in caliber, and is anatomically normal.  SVG was injected is normal in caliber.  There is moderate diffuse disease in the graft.  Prox Graft to Mid Graft lesion, 40% stenosed. The lesion was previously treated with a stent (unknown type).  Mid Graft lesion, 50% stenosed.  SVG was injected is normal in caliber.  The graft exhibits minimal luminal irregularities.  SVG was injected is normal in caliber, and is  anatomically normal.  SVG .  Origin lesion, 100% stenosed.   Normal LV function with an ejection fraction of 55%.    ASSESSMENT AND PLAN:  1. Syncopal episode: Loss of consciousness causing fall with injury and mild concussion. He had orthostatics completed today and was found to be positive.   Lying        128/70    HR 76 Sitting      106/60    HR 71 Standing  97/54      HR 69 Standing 112/66     HR 67  (after 3 minutes)  I have spoken with Dr. Claiborne Billings who has graciously agreed to go over the medications with me as I think he will need to adjust or stop either quinapril.  After reviewing the medications and his case, it has been decided to stop the quinapril and increase amiodarone to 300 mg daily.  I will see him again in 2 weeks and check his status with an EKG and BP. He will keep his appointment with Dr. Claiborne Billings in February as previously scheduled.   2. CAD: Hx of CABG with most recent cardiac cath as above. He is off of Plavix and will continue ASA.  3. Atrial fib: Amiodarone is increased to 300 mg daily and he is to continue Eliquis. Will see how he does with extra dose on follow up.   4. Hypercholesterolemia: Continue Simvastatin 40 mg daily.   Current medicines are reviewed at length with the patient today.    Labs/ tests ordered today include: None  Phill Myron. West Pugh, ANP, AACC   10/14/2018 12:04 PM    [facility] [place] [address] Office [phone] Fax [phone]

## 2018-10-14 ENCOUNTER — Ambulatory Visit: Payer: Medicare Other | Admitting: Adult Health

## 2018-10-14 ENCOUNTER — Encounter: Payer: Self-pay | Admitting: Adult Health

## 2018-10-14 ENCOUNTER — Telehealth: Payer: Self-pay | Admitting: Cardiovascular Disease

## 2018-10-14 VITALS — BP 128/70 | HR 73 | Ht 68.0 in | Wt 164.0 lb

## 2018-10-14 DIAGNOSIS — I10 Essential (primary) hypertension: Secondary | ICD-10-CM

## 2018-10-14 DIAGNOSIS — I4891 Unspecified atrial fibrillation: Secondary | ICD-10-CM

## 2018-10-14 DIAGNOSIS — I951 Orthostatic hypotension: Secondary | ICD-10-CM | POA: Diagnosis not present

## 2018-10-14 DIAGNOSIS — I2510 Atherosclerotic heart disease of native coronary artery without angina pectoris: Secondary | ICD-10-CM | POA: Diagnosis not present

## 2018-10-14 DIAGNOSIS — R55 Syncope and collapse: Secondary | ICD-10-CM | POA: Diagnosis not present

## 2018-10-14 DIAGNOSIS — E7800 Pure hypercholesterolemia, unspecified: Secondary | ICD-10-CM

## 2018-10-14 MED ORDER — SIMVASTATIN 40 MG PO TABS: 20.0000 mg | ORAL_TABLET | Freq: Every evening | ORAL | 2 refills | Status: DC

## 2018-10-14 MED ORDER — AMIODARONE HCL 200 MG PO TABS: 300.0000 mg | ORAL_TABLET | Freq: Every day | ORAL | 6 refills | Status: DC

## 2018-10-14 NOTE — Telephone Encounter (Signed)
New Message  Pt c/o medication issue:  1. Name of Medication: amiodarone (PACERONE) 200 MG tablet  2. How are you currently taking this medication (dosage and times per day)? Take 1.5 tablets (300 mg total) by mouth daily  3. Are you having a reaction (difficulty breathing--STAT)?   4. What is your medication issue? Ike at [facility] drug is calling, states the pt takes  1.5 tablets a day but the prescription qty is only for 30 and should be 45. Also watnst o check on a possible drug interaction with Simvastatin. Please call

## 2018-10-14 NOTE — Patient Instructions (Signed)
Medication Instructions:  STOP QUINAPRIL  INCREASE AMIODARONE 300MG  (1-1/2 TAB) DAILY  If you need a refill on your cardiac medications before your next appointment, please call your pharmacy.  Labwork: Take the provided lab slips with you to the lab for your blood draw.  When you have your labs (blood work) drawn today and your tests are completely normal, you will receive your results only by MyChart Message (if you have MyChart) -OR-  A paper copy in the mail.  If you have any lab test that is abnormal or we need to change your treatment, we will call you to review these results.  Follow-Up: You will need a follow up appointment in 2 weeks, MAKE SURE TO KEEP SCHEDULED FEB APPT WITH DR Claiborne Billings.  You may see Shelva Majestic, MD Jory Sims, DNP, AACC or one of the following Advanced Practice Providers on your designated Care Team:  Almyra Deforest, [place]   Fabian Sharp, PA-C   At [facility], you and your health needs are our priority.  As part of our continuing mission to provide you with exceptional heart care, we have created designated Provider Care Teams.  These Care Teams include your primary Cardiologist (physician) and Advanced Practice Providers (APPs -  Physician Assistants and Nurse Practitioners) who all work together to provide you with the care you need, when you need it.  Thank you for choosing [facility] at [facility]!!

## 2018-10-14 NOTE — Telephone Encounter (Signed)
Spoke with Obie Dredge at [facility]. Verbal OK to update quantity of amiodarone to #45 given that patient is now taking 1.5 tablets of 200mg  QD. Per our records, patient is taking simvastatin 20mg  QD. Per CVRR, OK to take this low dose with amiodarone. Ike states patient has been getting 40mg  tablets and he will clarify with patient. If there is a discrepancy, he will call back.

## 2018-10-14 NOTE — Telephone Encounter (Signed)
DDI between amiodarone and simvastatin known and okay to proceed with low dose simvastatin 20mg  daily.

## 2018-10-14 NOTE — Telephone Encounter (Signed)
Routed to CVRR to advise on drug-drug interaction with amiodarone 300mg  QD and simvastatin 40mg  (take 20mg  QHS)  Will send in updated script for correct quantity per med increase once reviewed

## 2018-10-15 DIAGNOSIS — R262 Difficulty in walking, not elsewhere classified: Secondary | ICD-10-CM | POA: Diagnosis not present

## 2018-10-18 DIAGNOSIS — R319 Hematuria, unspecified: Secondary | ICD-10-CM | POA: Diagnosis not present

## 2018-10-18 DIAGNOSIS — I4821 Permanent atrial fibrillation: Secondary | ICD-10-CM | POA: Diagnosis not present

## 2018-10-18 DIAGNOSIS — E039 Hypothyroidism, unspecified: Secondary | ICD-10-CM | POA: Diagnosis not present

## 2018-10-18 DIAGNOSIS — R55 Syncope and collapse: Secondary | ICD-10-CM | POA: Diagnosis not present

## 2018-10-18 DIAGNOSIS — I10 Essential (primary) hypertension: Secondary | ICD-10-CM | POA: Diagnosis not present

## 2018-10-23 DIAGNOSIS — R262 Difficulty in walking, not elsewhere classified: Secondary | ICD-10-CM | POA: Diagnosis not present

## 2018-10-28 NOTE — Progress Notes (Signed)
Cardiology Office Note   Date:  10/29/2018   ID:  Douglas Bray, DOB 12-23-1928, MRN [id number]  PCP:  Garwin Brothers, MD  Cardiologist: Peter Congo chief complaint on file.    History of Present Illness: Douglas Bray is a 85 y.o. male who presents for ongoing assessment and management of CAD, with hx of CABG (LIMA to LAD, SVG to diagonal, SVG to the marginal, and SVG to PDA. He has had complex 2 vessel PCI to the diagonal vessel with stenting, and PCI to the Cx graft. He has severe TR and mild pulmonary HTN. Other hx of HTN, and HL.   Most recent cardiac cath was completed on 08/2015 revealed severe native CAD with total occlusion of the very proximal LAD after a small septal perforating artery, total occlusion of the very proximal left circumflex vessel after small OM vessel and total occlusion of the proximal RCA. He is a patent LIMA graft supplying the mid LAD. There was a patent vein graft supplying the distal circumflex marginal with the previously placed stent in the midportion of the graft with intimal hyperplasia. Narrowing of 30-40% and 50% somewhat eccentric narrowing in the more distal portion of the graft. He had septal collaterals to the proximal LAD from the distal OM branch. Vein graft which probably had previously supplied the optional diagonal vessel. There was a patent vein graft to the diagonal vessel, which with previously placed stent that is widely patent. In the midportion of the graft. The vein graft supplying the PDA and a dominant RCA system was patent. Medical therapy was recommended.  He was also found to have elevated TSH in 2018 with level of 8.95. He was increased on levothyroxine to 50 . He was last seen on 09/24/2018, by Dr. Claiborne Billings. He was found to be in atrial fibrillation. Plavix was discontinued and he was started on Eliquis 5 mg BID. He was continued on ASA. Amiodarone was started on 200 mg for rate control and hopefully to have pharmacologic  cardioversion.   He was last seen in the office on 10/14/2018 after a syncopal episode, falling and hitting his head sustaining a laceration. He had been hospitalized at [facility] overnight. On that visit he was found to orthostatic. Quinapril was discontinued, and amiodarone was increased to 300 mg daily. These medication changes were reviewed with Dr. Claiborne Billings.    He is feeling much better today. No further episodes of falling or dizziness. He is tolerating the higher dose of amiodarone and discontinuation of quinapril.  His wife is still concerned about him going back to driving.  .  Past Medical History:  Diagnosis Date  . CAD (coronary artery disease)   . Dyslipidemia   . Hypertension     Past Surgical History:  Procedure Laterality Date  . CARDIAC CATHETERIZATION N/A 09/03/2015   Procedure: Right/Left Heart Cath and Coronary/Graft Angiography;  Surgeon: Troy Sine, MD;  Location: [place] [facility];  Service: Cardiovascular;  Laterality: N/A;  . CORONARY ARTERY BYPASS GRAFT  02/01/1998   LIMA to LAD,SVG to diagonal,SVG to marginal, SVG to PDA  . KNEE SURGERY  1986  . NM MYOCAR PERF WALL MOTION  03/08/2012   old septal infarct, no reversible ischemia, PAF w/RVR w/symptomatic hypotension.  Marland Kitchen US ECHOCARDIOGRAPHY  10/19/2010   mod to severe TR,mild AI     Current Outpatient Medications  Medication Sig Dispense Refill  . amiodarone (PACERONE) 200 MG tablet Take 1.5 tablets (300 mg total) by mouth daily.  30 tablet 6  . apixaban (ELIQUIS) 5 MG TABS tablet Take 1 tablet (5 mg total) by mouth 2 (two) times daily. 60 tablet 6  . aspirin EC 81 MG tablet Take 81 mg by mouth daily.    Marland Kitchen atenolol (TENORMIN) 25 MG tablet TAKE ONE-HALF TABLET DAILY 45 tablet 10  . Fish Oil OIL Take 1 capsule by mouth 2 (two) times daily.    Marland Kitchen levothyroxine (SYNTHROID, LEVOTHROID) 50 MCG tablet TAKE ONE TABLET EVERY MORNING 90 tablet 1  . Methylcobalamin (B12-ACTIVE PO) Take 1 tablet by mouth daily.     . naproxen sodium (ANAPROX) 220 MG tablet Take 220 mg by mouth 2 (two) times daily as needed (pain).    . nitroGLYCERIN (NITROSTAT) 0.4 MG SL tablet Place 1 tablet (0.4 mg total) under the tongue every 5 (five) minutes as needed for chest pain (x 3 doses). 30 tablet 1  . simvastatin (ZOCOR) 40 MG tablet Take 0.5 tablets (20 mg total) by mouth every evening. 30 tablet 2   No current facility-administered medications for this visit.     Allergies:   Ramipril    Social History:  The patient  reports that he has quit smoking. His smokeless tobacco use includes chew. He reports that he does not drink alcohol or use drugs.   Family History:  The patient's family history is not on file.    ROS: All other systems are reviewed and negative. Unless otherwise mentioned in H&P    PHYSICAL EXAM: VS:  BP 128/73   Pulse 69   Ht 5\' 8"  (1.727 m)   Wt 166 lb 12.8 oz (75.7 kg)   BMI 25.36 kg/m  , BMI Body mass index is 25.36 kg/m. GEN: Well nourished, well developed, in no acute distress HEENT: normal. Some mild skin discoloration is noted on the left parietal and left eye orbit, much improved from last visit. Neck: no JVD, carotid bruits, or masses Cardiac: IRRR; 2/6 high pitched systolic murmurs, rubs, or gallops,no edema  Respiratory:  Clear to auscultation bilaterally, normal work of breathing GI: soft, nontender, nondistended, + BS MS: no deformity or atrophy Skin: warm and dry, no rash Neuro:  Strength and sensation are intact Psych: euthymic mood, full affect   EKG:  Not completed this office visit.   Recent Labs: 07/22/2018: ALT 8; BUN 13; Creatinine, Ser 1.21; Hemoglobin 12.3; Platelets 165; Potassium 4.6; Sodium 141; TSH 5.260    Lipid Panel    Component Value Date/Time   CHOL 110 07/22/2018 0933   TRIG 104 07/22/2018 0933   HDL 38 (L) 07/22/2018 0933   CHOLHDL 2.9 07/22/2018 0933   CHOLHDL 3.7 01/11/2015 1127   VLDL 36 01/11/2015 1127   LDLCALC 51 07/22/2018 0933        Wt Readings from Last 3 Encounters:  10/29/18 166 lb 12.8 oz (75.7 kg)  10/14/18 164 lb (74.4 kg)  09/24/18 167 lb 3.2 oz (75.8 kg)      Other studies Reviewed: Echocardiogram 2018/08/15 Left ventricle: The cavity size was normal. Wall thickness was increased in a pattern of moderate LVH. Systolic function was normal. The estimated ejection fraction was in the range of 55% to 60%. Basal septal hypokinesis. The study is not technically sufficient to allow evaluation of LV diastolic function. - Aortic valve: Calcified with moderate stenosis. Trivial regurgitation. Mean gradient (S): 12 mm Hg. Peak gradient (S): 22 mm Hg. Valve area (VTI): 0.93 cm^2. Valve area (Vmax): 1 cm^2. Valve area (Vmean): 1.04 cm^2. -  Mitral valve: Mildly thickened leaflets . There was mild regurgitation. - Left atrium: Moderately dilated. - Right ventricle: The cavity size was moderately dilated. Moderate hypokinesis. - Right atrium: The atrium was mildly dilated. - Tricuspid valve: There was moderate regurgitation. - Pulmonary arteries: PA peak pressure: 28 mm Hg (S). - Inferior vena cava: The vessel was normal in size. The respirophasic diameter changes were in the normal range (= 50%), consistent with normal central venous pressure.  Cardiac Catheterization 09/03/2015   Mid Cx lesion, 100% stenosed.  Prox LAD lesion, 100% stenosed.  Prox RCA lesion, 100% stenosed.  LIMA was injected is normal in caliber, and is anatomically normal.  SVG was injected is normal in caliber.  There is moderate diffuse disease in the graft.  Prox Graft to Mid Graft lesion, 40% stenosed. The lesion was previously treated with a stent (unknown type).  Mid Graft lesion, 50% stenosed.  SVG was injected is normal in caliber.  The graft exhibits minimal luminal irregularities.  SVG was injected is normal in caliber, and is anatomically normal.  SVG .  Origin lesion, 100%  stenosed.  Normal LV function with an ejection fraction of 55%.    ASSESSMENT AND PLAN:  1. Atrial fib:  His HR is irregular today. He denies chest pain or bleeding on Eliquis. Tolerating higher dose of amiodarone at 300 mg. He is due to see his PCP next month, I have advised that he have TSH checked with higher dose of amiodarone.   2. Orthostatic Hypotension: He is feeling better off of the lisinopril and offers no complaints of dizziness. He is not yet driving. His wife wants him to wait until he sees Dr. Claiborne Billings on follow up before going back to driving.   3. CAD: Multiple interventions and CABG. He is without chest pain or DOE. No changes in any of his medications at this time.     4.Severe TR:  Currently asymptomatic. Consider referral to valve clinic at Dr. Evette Georges discretion.   Current medicines are reviewed at length with the patient today.  PCP office.   Phill Myron. West Pugh, ANP, [facility]   10/29/2018 11:49 AM    [facility] [place] [address] Office [phone] Fax [phone]

## 2018-10-29 ENCOUNTER — Ambulatory Visit: Payer: Medicare Other | Admitting: Adult Health

## 2018-10-29 ENCOUNTER — Encounter: Payer: Self-pay | Admitting: Adult Health

## 2018-10-29 VITALS — BP 128/73 | HR 69 | Ht 68.0 in | Wt 166.8 lb

## 2018-10-29 DIAGNOSIS — I952 Hypotension due to drugs: Secondary | ICD-10-CM

## 2018-10-29 DIAGNOSIS — I2510 Atherosclerotic heart disease of native coronary artery without angina pectoris: Secondary | ICD-10-CM | POA: Diagnosis not present

## 2018-10-29 DIAGNOSIS — I480 Paroxysmal atrial fibrillation: Secondary | ICD-10-CM

## 2018-10-29 DIAGNOSIS — I361 Nonrheumatic tricuspid (valve) insufficiency: Secondary | ICD-10-CM

## 2018-10-29 NOTE — Patient Instructions (Signed)
Follow-Up: You will need a follow up appointment [place]. You may see Shelva Majestic, MD or one of the following Advanced Practice Providers on your designated Care Team:  Almyra Deforest, PA-C  Fabian Sharp, [place]   Medication Instructions:  NO CHANGES- Your physician recommends that you continue on your current medications as directed. Please refer to the Current Medication list given to you today. If you need a refill on your cardiac medications before your next appointment, please call your pharmacy. Labwork: When you have labs (blood work) and your tests are completely normal, you will receive your results ONLY by [place] (if you have MyChart) -OR- A paper copy in the mail.  At [facility], you and your health needs are our priority.  As part of our continuing mission to provide you with exceptional heart care, we have created designated Provider Care Teams.  These Care Teams include your primary Cardiologist (physician) and Advanced Practice Providers (APPs -  Physician Assistants and Nurse Practitioners) who all work together to provide you with the care you need, when you need it.  Thank you for choosing [facility] at [facility]!!

## 2018-10-30 DIAGNOSIS — R262 Difficulty in walking, not elsewhere classified: Secondary | ICD-10-CM | POA: Diagnosis not present

## 2018-11-06 DIAGNOSIS — R262 Difficulty in walking, not elsewhere classified: Secondary | ICD-10-CM | POA: Diagnosis not present

## 2018-12-02 ENCOUNTER — Ambulatory Visit: Payer: Medicare Other | Admitting: Cardiovascular Disease

## 2018-12-02 ENCOUNTER — Encounter: Payer: Self-pay | Admitting: Cardiovascular Disease

## 2018-12-02 VITALS — BP 110/66 | HR 49 | Ht 68.0 in | Wt 169.6 lb

## 2018-12-02 DIAGNOSIS — I480 Paroxysmal atrial fibrillation: Secondary | ICD-10-CM | POA: Diagnosis not present

## 2018-12-02 DIAGNOSIS — Z5181 Encounter for therapeutic drug level monitoring: Secondary | ICD-10-CM

## 2018-12-02 DIAGNOSIS — R001 Bradycardia, unspecified: Secondary | ICD-10-CM

## 2018-12-02 DIAGNOSIS — I350 Nonrheumatic aortic (valve) stenosis: Secondary | ICD-10-CM | POA: Diagnosis not present

## 2018-12-02 DIAGNOSIS — E039 Hypothyroidism, unspecified: Secondary | ICD-10-CM | POA: Diagnosis not present

## 2018-12-02 DIAGNOSIS — Z7901 Long term (current) use of anticoagulants: Secondary | ICD-10-CM

## 2018-12-02 DIAGNOSIS — I10 Essential (primary) hypertension: Secondary | ICD-10-CM

## 2018-12-02 MED ORDER — AMIODARONE HCL 200 MG PO TABS: 200.0000 mg | ORAL_TABLET | Freq: Every day | ORAL | 6 refills | Status: DC

## 2018-12-02 NOTE — Progress Notes (Signed)
Patient ID: Douglas Bray, male   DOB: [birth year], 85 y.o.   MRN: [id number]     HPI: ARTHURO CANELO is a 85 y.o. male who presents for an 2 month cardiology evaluation.  Mr. Titsworth has known CAD and in April 1999 underwent CABG revascularization surgery LIMA to the LAD, vein to the diagonal, vein to the marginal, and vein to the PDA as well as optional diagonal vessel. In April 2002 he underwent complex 2 vessel intervention to the diagonal vessel with stenting as well as stenting to the circumflex graft. In January 2012 echo Doppler study suggested normal systolic function with diastolic dysfunction. He had moderate to severe tricuspid regurgitation with mild pulmonary hypertension, mild aortic insufficiency, mild mitral regurgitation.  In May 2013 due to episodes of left arm discomfort a nuclear perfusion study was performed which continued to show normal perfusion.  An echo Doppler study on 05/14/2013  revealed mild LVH with normal systolic function. Diastolic parameters were normal. There was very mild aortic stenosis with mildly thickened trileaflet valve with mild AR. He had mild mitral regurgitation, moderately severe tricuspid regurgitation and mild pulmonary hypertension with a PA pressure 37 mm. His mean aortic gradient was 7 with a peak gradient of 16.  Additional problems include hypertension and hyperlipidemia.  Since I last saw him, he underwent a 2-D echo Doppler study in April 2016 which showed an ejection fraction of 55-60% with mild concentric LVH and grade 2 diastolic dysfunction.  His mean gradient across his aortic valve was 9 with a peak gradient of 17 and his aortic valve area range from 1.7-2.04 cm.  A nuclear perfusion study revealed normal perfusion.  In 2016 he was working hard and typically 12-14 hours a day.  He admits to increasing fatigue but denies chest pain or shortness of breath.  He is not resting well.  He is unaware of palpitations.  He denies PND,  orthopnea.  When I last saw him, he had developed new deep T-wave inversion inferiorly, which had not been present April 2016 at the time of his stress test.  Plavix was added to his medical rate regimen and nitrates were increased.  On 08/29/2015.  He underwent right and left heart cardiac catheterization.  He normal LV function with an EF of 55%.  His right heart pressures were normal.  There was very minimal aortic valve stenosis.  He has severe native CAD with total occlusion of the very proximal LAD after a small septal perforating artery, total occlusion of the very proximal left circumflex vessel after small OM vessel and total occlusion of the proximal RCA.  He is a patent LIMA graft supplying the mid LAD.  There was a pain, vein graft supplying the distal circumflex marginal with the previously placed stent in the midportion of the graft with intimal hyperplasia.  Narrowing of 30-40% and 50% somewhat eccentric narrowing in the more distal portion of the graft.  He had septal collaterals to the proximal LAD from the distal OM branch.  Vein graft which probably had previously supplied the optional diagonal vessel.  There was a patent vein graft to the diagonal vessel, which with previously placed stent that is widely patent.  In the midportion of the graft.  The vein graft supplying the PDA and a dominant RCA system was patent.  Medical therapy was recommended.  [place] after his wife of 1 years, 2 months and 15 days had passed away.  He 08-31-2023  2017, he re-married his wife's best friend.  When I last saw him, I recommended he undergo a follow-up echo Doppler study.  As was done on 01/05/2017 and essentially was unchanged from previously.  He had normal EF at 55-60%, mild LVH, grade 2 diastolic dysfunction, and mild aortic valve stenosis with mild AR.  There was mild LA dilatation and mild pulmonary hypertension.  His mean aortic gradient was 12 mm with a peak gradient of 22  mm.  He had follow-up laboratory which reconfirmed mild TSH elevation which had increased 8.95.  I called in levothyroxine initially at 25 g which was titrated to 50 ug.  When I saw him in May 2019 he was doing well and denied any chest pain, PND orthopnea, presyncope or syncope.   He had some arthritic issues.  Laboratory in October 2018 showed a total cholesterol 128, LDL 59.  Triglycerides are 158.  He had normal renal function.    I last saw him in December 2019. At that time  he had not been as active as he had in the past.  He denied any episodes of chest pressure.  He denied any episodes of tachycardia.  He had undergone an echo Doppler study in November 2019 which showed normal LV function with an EF of 55 to 60%.  His aortic stenosis had progressed and was now felt to be moderate with a mean gradient of 12, peak gradient of 22, and valve area of 1.1 cm.  He denied any presyncope or syncope.  When I saw him in December 2019 his ECG showed new atrial fibrillation.  He was unaware of this arrhythmia.  That time, I recommended discontinuance of Plavix and started him on Eliquis 5 mg twice a day.  Initiated amiodarone 20 mg daily in attempt to restore sinus rhythm.  He was seen in follow-up on October 14, 2018 Jory Sims after he had experienced a syncopal spell where he had fallen and hit his head sustaining a laceration.  He had been hospitalized at [facility] overnight.  During that visit he was found to be orthostatic, quinapril was discontinued and his amiodarone was increased to 300 mg daily.  He again saw [name] for his most recent follow-up on October 29, 2018.  Presently, he is unaware of any sense of irregular heart rhythm.  He is now on amiodarone 300 mg daily, Eliquis 5 mg twice a day, aspirin 81 mg, atenolol 12.5 mg daily in addition to levothyroxine 50 mcg and simvastatin 40 mg.  He presents for reevaluation.  Allergies  Allergen Reactions  . Ramipril  Cough    Current Outpatient Medications  Medication Sig Dispense Refill  . amiodarone (PACERONE) 200 MG tablet Take 1 tablet (200 mg total) by mouth daily. 30 tablet 6  . apixaban (ELIQUIS) 5 MG TABS tablet Take 1 tablet (5 mg total) by mouth 2 (two) times daily. 60 tablet 6  . atenolol (TENORMIN) 25 MG tablet TAKE ONE-HALF TABLET DAILY 45 tablet 10  . cyanocobalamin 1000 MCG tablet Take 1,000 mcg by mouth daily.    . Fish Oil OIL Take 1 capsule by mouth 2 (two) times daily.    Marland Kitchen levothyroxine (SYNTHROID, LEVOTHROID) 50 MCG tablet TAKE ONE TABLET EVERY MORNING 90 tablet 1  . Methylcobalamin (B12-ACTIVE PO) Take 1 tablet by mouth daily.    . naproxen sodium (ANAPROX) 220 MG tablet Take 220 mg by mouth 2 (two) times daily as needed (pain).    . nitroGLYCERIN (NITROSTAT)  0.4 MG SL tablet Place 1 tablet (0.4 mg total) under the tongue every 5 (five) minutes as needed for chest pain (x 3 doses). 30 tablet 1  . simvastatin (ZOCOR) 40 MG tablet Take 0.5 tablets (20 mg total) by mouth every evening. 30 tablet 2   No current facility-administered medications for this visit.     Social History   Socioeconomic History  . Marital status: Widowed    Spouse name: Not on file  . Number of children: Not on file  . Years of education: Not on file  . Highest education level: Not on file  Occupational History  . Not on file  Social Needs  . Financial resource strain: Not on file  . Food insecurity:    Worry: Not on file    Inability: Not on file  . Transportation needs:    Medical: Not on file    Non-medical: Not on file  Tobacco Use  . Smoking status: Former Research scientist (life sciences)  . Smokeless tobacco: Current User    Types: Chew  . Tobacco comment: quit smoking 50 years ago. chews occas.  Substance and Sexual Activity  . Alcohol use: No  . Drug use: No  . Sexual activity: Not on file  Lifestyle  . Physical activity:    Days per week: Not on file    Minutes per session: Not on file  . Stress: Not on  file  Relationships  . Social connections:    Talks on phone: Not on file    Gets together: Not on file    Attends religious service: Not on file    Active member of club or organization: Not on file    Attends meetings of clubs or organizations: Not on file    Relationship status: Not on file  . Intimate partner violence:    Fear of current or ex partner: Not on file    Emotionally abused: Not on file    Physically abused: Not on file    Forced sexual activity: Not on file  Other Topics Concern  . Not on file  Social History Narrative  . Not on file   Social history is notable in that he is widowedand re-married;  has 2 children, one deceased, 5 grandchildren 8 great-grandchildren. There is no call use. He previously chewed tobacco. His wife did suffer a CVA.  History reviewed. No pertinent family history.  Both parents are deceased.  He has 4 living brothers and one deceased.  ROS General: Negative; No fevers, chills, or night sweats;  HEENT: Negative; No changes in vision or hearing, sinus congestion, difficulty swallowing Pulmonary: Negative; No cough, wheezing, shortness of breath, hemoptysis Cardiovascular: Negative; No chest pain, presyncope, syncope, palpatations GI: Negative; No nausea, vomiting, diarrhea, or abdominal pain GU: Negative; No dysuria, hematuria, or difficulty voiding Musculoskeletal: Positive for bilateral knee discomfort Hematologic/Oncology: Negative; no easy bruising, bleeding Endocrine: Negative; no heat/cold intolerance; no diabetes Neuro: Negative; no changes in balance, headaches Skin: Negative; No rashes or skin lesions Psychiatric: Negative; No behavioral problems, depression Sleep: Negative; No snoring, daytime sleepiness, hypersomnolence, bruxism, restless legs, hypnogognic hallucinations, no cataplexy Other comprehensive 14 point system review is negative.   PE BP 110/66   Pulse (!) 49   Ht '5\' 8"'  (1.727 m)   Wt 169 lb 9.6 oz (76.9 kg)    BMI 25.79 kg/m    Repeat blood pressure by me was 108/68 supine and 98/64 standing.  Wt Readings from Last 3 Encounters:  12/02/18 169 lb  9.6 oz (76.9 kg)  10/29/18 166 lb 12.8 oz (75.7 kg)  10/14/18 164 lb (74.4 kg)   General: Alert, oriented, no distress.  Skin: normal turgor, no rashes, warm and dry HEENT: Normocephalic, atraumatic. Pupils equal round and reactive to light; sclera anicteric; extraocular muscles intact;  Nose without nasal septal hypertrophy Mouth/Parynx benign; Mallinpatti scale 3 Neck: No JVD, no carotid bruits; normal carotid upstroke Lungs: clear to ausculatation and percussion; no wheezing or rales Chest wall: without tenderness to palpitation Heart: PMI not displaced, regular rhythm and bradycardic around 50 bpm, s1 s2 normal, 1/6 systolic murmur, no diastolic murmur, no rubs, gallops, thrills, or heaves Abdomen: soft, nontender; no hepatosplenomehaly, BS+; abdominal aorta nontender and not dilated by palpation. Back: no CVA tenderness Pulses 2+ Musculoskeletal: full range of motion, normal strength, no joint deformities Extremities: no clubbing cyanosis or edema, Homan's sign negative  Neurologic: grossly nonfocal; Cranial nerves grossly wnl Psychologic: Normal mood and affect   ECG (independently read by me): Sinus Bradycardia 49; First degree V block; PR 214 msec  December 2019 ECG (independently read by me): Atrial fibrillation with a ventricular rate in the 70s.  Poor anterior R wave progression.  QTc interval 436 ms.  May 2019 ECG (independently read by me): Sinus bradycardia with first-degree AV block.  PR interval 212 ms.  No ST segment changes.  October 2018 ECG (independently read by me): Sinus bradycardia at 52 bpm.  First-degree AV block with a PR interval of 214 ms.  Borderline voltage criteria for LVH.  QTc interval 403 ms.  No significant ST-T changes.  March 2018 ECG (independently read by me): Sinus bradycardia 57 bpm.  LVH by voltage  criteria.  Nonspecific ST changes.  Normal intervals.  August 2017 ECG (independently read by me): Normal sinus rhythm at 60 bpm.  LVH by voltage criteria.  Previous deep T-wave inversion inferiorly.  Is no longer present.  ECG (independently read by me): Sinus bradycardia 53 bpm with PAC.  New significant T-wave inversion in leads 3 and aVF which was not present on his April 2016 and prior 2015 ECGs.  April 2016 ECG (independently read by me): Sinus bradycardia 50 bpm.  Poor progression V1 through V3.  No significant ST segment changes  May 2015 ECG (independently read by me): Sinus bradycardia 53 beats per minute.  PR interval 176 ms, QTc interval normal at 470 ms.  No significant ST-T changes.  Prior December 2014 ECG: Sinus rhythm at 53 beats per minute. No ectopy. Normal intervals.  LABS:  BMP Latest Ref Rng & Units 12/02/2018 07/22/2018 12/15/2016  Glucose 65 - 99 mg/dL 76 100(H) 92  BUN 8 - 27 mg/dL '17 13 16  ' Creatinine 0.76 - 1.27 mg/dL 1.50(H) 1.21 1.46(H)  BUN/Creat Ratio 10 - '24 11 11 11  ' Sodium 134 - 144 mmol/L 142 141 141  Potassium 3.5 - 5.2 mmol/L 4.8 4.6 4.8  Chloride 96 - 106 mmol/L 102 104 101  CO2 20 - 29 mmol/L '25 26 25  ' Calcium 8.6 - 10.2 mg/dL 9.4 9.2 9.7   Hepatic Function Latest Ref Rng & Units 07/22/2018 12/15/2016 01/11/2015  Total Protein 6.0 - 8.5 g/dL 6.6 6.8 7.1  Albumin 3.5 - 4.7 g/dL 4.0 4.0 4.3  AST 0 - 40 IU/L '14 20 20  ' ALT 0 - 44 IU/L '8 11 14  ' Alk Phosphatase 39 - 117 IU/L 49 42 38(L)  Total Bilirubin 0.0 - 1.2 mg/dL 0.8 0.6 0.8   CBC Latest Ref Rng &  Units 07/22/2018 12/15/2016 08/31/2015  WBC 3.4 - 10.8 x10E3/uL 6.8 6.3 7.9  Hemoglobin 13.0 - 17.7 g/dL 12.3(L) 13.5 13.2  Hematocrit 37.5 - 51.0 % 35.9(L) 40.4 38.5(L)  Platelets 150 - 450 x10E3/uL 165 185 204   Lab Results  Component Value Date   MCV 95 07/22/2018   MCV 97 12/15/2016   MCV 94.4 08/31/2015   Lab Results  Component Value Date   TSH 5.260 (H) 07/22/2018   No results found for:  HGBA1C   Lipid Panel     Component Value Date/Time   CHOL 110 07/22/2018 0933   TRIG 104 07/22/2018 0933   HDL 38 (L) 07/22/2018 0933   CHOLHDL 2.9 07/22/2018 0933   CHOLHDL 3.7 01/11/2015 1127   VLDL 36 01/11/2015 1127   LDLCALC 51 07/22/2018 0933   RADIOLOGY: No results found.  IMPRESSION:  1. Essential hypertension   2. Paroxysmal atrial fibrillation (HCC)   3. Alteration in anticoagulation   4. Moderate aortic stenosis   5. Hypothyroidism, unspecified type   6. Sinus bradycardia     ASSESSMENT AND PLAN: Mr. CANNON ARREOLA is an active 85 year old gentleman who is status post CABG revascularization surgery In 1999.  His  nuclear perfusion study in April 2016 continued to show fairly normal perfusion without ischemia.  On his echo in August 2014 he had very mild aortic stenosis with  normal systolic function. In 2016 he was found to have deep T-wave inversion inferiorly and I performed right and left heart cardiac catheterization which revealed severe native CAD with total occlusion of the very proximal LAD after small septal perforating artery, total occlusion of a proximal left circumflex vessel after small OM vessel, total occlusion of the proximal RCA.  His LIMA graft was widely patent as was the graft supplying the distal circumflex marginal with the previously placed stent in the midportion of the graft with intimal hyperplasia.  He had a patent vein graft to the diagonal vessel the previously placed stent that was widely patent.  Vein graft supplying the PDA remain patent.  He continues to be asymptomatic without recurrent anginal symptoms on medical therapy.  His most recent echo Doppler study from November 2019  showed an EF of 55 to 60%.  He again had a mean gradient of 12, peak gradient of 22, si,milar to his March 2018 study.  On his November 2019 study, aortic valve area was 1.1 cm suggestive of moderate aortic stenosis.  Since his evaluation by me in December 2019, he  has been on Eliquis anticoagulation and amiodarone.  His ECG today shows that he is converted back to sinus rhythm and is sinus bradycardic on his current dose of amiodarone 300 mg daily, atenolol 12.5 mg daily.  He continues to be on Eliquis and aspirin.  With his age, I have recommended he discontinue aspirin therapy but continue Eliquis at 5 mg.  He had been seen in the ER in January 2020 and at that time his creatinine was 1.63.  I recommended recheck of his creatinine be obtained today to determine if his Eliquis dose needs to be adjusted down to 2.5 mg.  He is bradycardic and I will decrease amiodarone to 200 mg daily.  He is to be on simvastatin 20 mg for hyperlipidemia.  He is on levothyroxine 50 mcg for hypothyroidism.  Several months I recommend he follow-up with Jory Sims, NP for evaluation.  I will see him in 4 months for reevaluation with me.   Time Spent:  25 minutes  Troy Sine, MD, [facility]  12/04/2018 12:56 PM

## 2018-12-02 NOTE — Patient Instructions (Signed)
Medication Instructions:  Decrease Amiodarone to 200 mg. Stop taking Naproxen  Stop Aspirin  If you need a refill on your cardiac medications before your next appointment, please call your pharmacy.   Lab work: BMET today If you have labs (blood work) drawn today and your tests are completely normal, you will receive your results only by: Marland Kitchen MyChart Message (if you have MyChart) OR . A paper copy in the mail If you have any lab test that is abnormal or we need to change your treatment, we will call you to review the results.  Follow-Up: At [facility], you and your health needs are our priority.  As part of our continuing mission to provide you with exceptional heart care, we have created designated Provider Care Teams.  These Care Teams include your primary Cardiologist (physician) and Advanced Practice Providers (APPs -  Physician Assistants and Nurse Practitioners) who all work together to provide you with the care you need, when you need it. . Follow up with Jory Sims, NP in 2 months . Follow up with Dr.Kelly in 4 months

## 2018-12-03 LAB — BASIC METABOLIC PANEL
BUN/Creatinine Ratio: 11 (ref 10–24)
BUN: 17 mg/dL (ref 8–27)
CO2: 25 mmol/L (ref 20–29)
Calcium: 9.4 mg/dL (ref 8.6–10.2)
Chloride: 102 mmol/L (ref 96–106)
Creatinine, Ser: 1.5 mg/dL — ABNORMAL HIGH (ref 0.76–1.27)
GFR calc Af Amer: 48 mL/min/{1.73_m2} — ABNORMAL LOW (ref >59)
GFR calc non Af Amer: 41 mL/min/{1.73_m2} — ABNORMAL LOW (ref >59)
Glucose: 76 mg/dL (ref 65–99)
Potassium: 4.8 mmol/L (ref 3.5–5.2)
Sodium: 142 mmol/L (ref 134–144)

## 2018-12-04 ENCOUNTER — Encounter: Payer: Self-pay | Admitting: Cardiovascular Disease

## 2019-01-21 ENCOUNTER — Other Ambulatory Visit: Payer: Self-pay | Admitting: Cardiovascular Disease

## 2019-01-21 NOTE — Telephone Encounter (Signed)
Simvastatin 20 mg refilled. 

## 2019-01-27 ENCOUNTER — Other Ambulatory Visit: Payer: Self-pay

## 2019-01-30 ENCOUNTER — Other Ambulatory Visit: Payer: Self-pay

## 2019-01-30 MED ORDER — APIXABAN 2.5 MG PO TABS: 2.5000 mg | ORAL_TABLET | Freq: Two times a day (BID) | ORAL | 1 refills | Status: DC

## 2019-02-03 ENCOUNTER — Other Ambulatory Visit: Payer: Self-pay | Admitting: Cardiovascular Disease

## 2019-02-03 ENCOUNTER — Telehealth: Payer: Medicare Other | Admitting: Adult Health

## 2019-02-03 NOTE — Telephone Encounter (Signed)
New Message:     Janett Billow from [facility] called. She needs to let you know that they are switching manufacturer on the Levothyroxine  From the [place] brand to [place].

## 2019-02-04 MED ORDER — LEVOTHYROXINE SODIUM 50 MCG PO TABS: 50.0000 ug | ORAL_TABLET | Freq: Every morning | ORAL | 1 refills | Status: DC

## 2019-02-04 NOTE — Telephone Encounter (Signed)
Levothyroxine refilled

## 2019-02-04 NOTE — Telephone Encounter (Signed)
°*  STAT* If patient is at the pharmacy, call can be transferred to refill team.   1. Which medications need to be refilled? (please list name of each medication and dose if known) levothyroxine (SYNTHROID, LEVOTHROID) 50 MCG tablet  2. Which pharmacy/location (including street and city if local pharmacy) is medication to be sent to? [place], [place]  3. Do they need a 30 day or 90 day supply? 90 days   Patient called stating he is out of medication

## 2019-02-11 ENCOUNTER — Telehealth: Payer: Self-pay | Admitting: Cardiovascular Disease

## 2019-02-12 ENCOUNTER — Telehealth (INDEPENDENT_AMBULATORY_CARE_PROVIDER_SITE_OTHER): Payer: Medicare Other | Admitting: Cardiovascular Disease

## 2019-02-12 VITALS — BP 107/60 | HR 48 | Ht 68.0 in | Wt 161.0 lb

## 2019-02-12 DIAGNOSIS — I350 Nonrheumatic aortic (valve) stenosis: Secondary | ICD-10-CM

## 2019-02-12 DIAGNOSIS — R001 Bradycardia, unspecified: Secondary | ICD-10-CM | POA: Diagnosis not present

## 2019-02-12 DIAGNOSIS — Z951 Presence of aortocoronary bypass graft: Secondary | ICD-10-CM | POA: Diagnosis not present

## 2019-02-12 DIAGNOSIS — I2510 Atherosclerotic heart disease of native coronary artery without angina pectoris: Secondary | ICD-10-CM | POA: Diagnosis not present

## 2019-02-12 DIAGNOSIS — I480 Paroxysmal atrial fibrillation: Secondary | ICD-10-CM

## 2019-02-12 DIAGNOSIS — Z7901 Long term (current) use of anticoagulants: Secondary | ICD-10-CM

## 2019-02-12 DIAGNOSIS — I440 Atrioventricular block, first degree: Secondary | ICD-10-CM

## 2019-02-12 NOTE — Progress Notes (Signed)
Virtual Visit via Telephone Note   This visit type was conducted due to national recommendations for restrictions regarding the COVID-19 Pandemic (e.g. social distancing) in an effort to limit this patient's exposure and mitigate transmission in our community.  Due to his co-morbid illnesses, this patient is at least at moderate risk for complications without adequate follow up.  This format is felt to be most appropriate for this patient at this time.  The patient did not have access to video technology/had technical difficulties with video requiring transitioning to audio format only (telephone).  All issues noted in this document were discussed and addressed.  No physical exam could be performed with this format.  Please refer to the patient's chart for his  consent to telehealth for [facility].   Date:  02/12/2019   ID:  Douglas Bray, DOB Sep 03, 1929, MRN [id number]  Patient Location: Home Provider Location: Office  PCP:  Garwin Brothers, MD  Cardiologist:  Shelva Majestic, MD  Electrophysiologist:  None   Evaluation Performed:  Follow-Up Visit  Chief Complaint:  3 month F/U  History of Present Illness:    Douglas Bray is a 85 y.o. male who has known CAD and in April 1999 underwent CABG revascularization surgery LIMA to the LAD, vein to the diagonal, vein to the marginal, and vein to the PDA as well as optional diagonal vessel. In April 2002 he underwent complex 2 vessel intervention to the diagonal vessel with stenting as well as stenting to the circumflex graft. In January 2012 echo Doppler study suggested normal systolic function with diastolic dysfunction. He had moderate to severe tricuspid regurgitation with mild pulmonary hypertension, mild aortic insufficiency, mild mitral regurgitation.  In May 2013 due to episodes of left arm discomfort a nuclear perfusion study was performed which continued to show normal perfusion.  An echo Doppler study on 05/14/2013  revealed mild LVH  with normal systolic function. Diastolic parameters were normal. There was very mild aortic stenosis with mildly thickened trileaflet valve with mild AR. He had mild mitral regurgitation, moderately severe tricuspid regurgitation and mild pulmonary hypertension with a PA pressure 37 mm. His mean aortic gradient was 7 with a peak gradient of 16.  Additional problems include hypertension and hyperlipidemia.  Since I last saw him, he underwent a 2-D echo Doppler study in April 2016 which showed an ejection fraction of 55-60% with mild concentric LVH and grade 2 diastolic dysfunction.  His mean gradient across his aortic valve was 9 with a peak gradient of 17 and his aortic valve area range from 1.7-2.04 cm.  A nuclear perfusion study revealed normal perfusion.  In 2016 he was working hard and typically 12-14 hours a day.  He admits to increasing fatigue but denies chest pain or shortness of breath.  He is not resting well.  He is unaware of palpitations.  He denies PND, orthopnea.  When I last saw him, he had developed new deep T-wave inversion inferiorly, which had not been present April 2016 at the time of his stress test.  Plavix was added to his medical rate regimen and nitrates were increased.  On 08/29/2015.  He underwent right and left heart cardiac catheterization.  He normal LV function with an EF of 55%.  His right heart pressures were normal.  There was very minimal aortic valve stenosis.  He has severe native CAD with total occlusion of the very proximal LAD after a small septal perforating artery, total occlusion of the very proximal left circumflex  vessel after small OM vessel and total occlusion of the proximal RCA.  He is a patent LIMA graft supplying the mid LAD.  There was a pain, vein graft supplying the distal circumflex marginal with the previously placed stent in the midportion of the graft with intimal hyperplasia.  Narrowing of 30-40% and 50% somewhat eccentric narrowing in the more  distal portion of the graft.  He had septal collaterals to the proximal LAD from the distal OM branch.  Vein graft which probably had previously supplied the optional diagonal vessel.  There was a patent vein graft to the diagonal vessel, which with previously placed stent that is widely patent.  In the midportion of the graft.  The vein graft supplying the PDA and a dominant RCA system was patent.  Medical therapy was recommended.  [place] after his wife of 55 years, 2 months and 15 days had passed away.  He 2016/09/28, he re-married his wife's best friend.  When I last saw him, I recommended he undergo a follow-up echo Doppler study.  As was done on 01/05/2017 and essentially was unchanged from previously.  He had normal EF at 55-60%, mild LVH, grade 2 diastolic dysfunction, and mild aortic valve stenosis with mild AR.  There was mild LA dilatation and mild pulmonary hypertension.  His mean aortic gradient was 12 mm with a peak gradient of 22 mm.  He had follow-up laboratory which reconfirmed mild TSH elevation which had increased 8.95.  I called in levothyroxine initially at 25 g which was titrated to 50 ug.  When I saw him in May 2019 he was doing well and denied any chest pain, PND orthopnea, presyncope or syncope.   He had some arthritic issues.  Laboratory in October 2018 showed a total cholesterol 128, LDL 59.  Triglycerides are 158.  He had normal renal function.    When I saw him in December 2019 he had not been as active as he had in the past.  He denied any episodes of chest pressure.  He denied any episodes of tachycardia.  He had undergone an echo Doppler study in 2018/09/28 which showed normal LV function with an EF of 55 to 60%.  His aortic stenosis had progressed and was now felt to be moderate with a mean gradient of 12, peak gradient of 22, and valve area of 1.1 cm.  He denied any presyncope or syncope.  When I saw him in December 2019 his ECG showed  new atrial fibrillation.  He was unaware of this arrhythmia.  I recommended discontinuance of Plavix and started him on Eliquis 5 mg twice a day.  Initiated amiodarone 200 mg daily in attempt to restore sinus rhythm.  He was seen in follow-up on October 14, 2018 by [name] after he had experienced a syncopal spell where he had fallen and hit his head sustaining a laceration.  He had been hospitalized at [facility].  During that visit he was found to be orthostatic, quinapril was discontinued and his amiodarone was increased to 300 mg daily.  He again saw [name] for follow-up on October 29, 2018.  When I last saw him in in February 2020, he was unaware of any sense of irregular heart rhythm.    Meds included amiodarone 300 mg daily, Eliquis 5 mg twice a day, aspirin 81 mg, atenolol 12.5 mg daily in addition to levothyroxine 50 mcg and simvastatin 40 mg.  During that evaluation, he was bradycardic and his ECG showed sinus bradycardia at 49 bpm with first-degree AV. I recommended dose reduction of amiodarone back down to 200 mg daily. His Eliquis dose was reduced to 2.5 mg twice a day due to renal insufficiency and his age.  Over the past several months, he has felt well.  He denies any episodes of chest pain or shortness of breath.  Heart rate continues to be low.  He denies any episodes of presyncope or syncope or awareness of recurrent atrial fibrillation.  He tells me in the future he may need to have a knee operation.  He had an operation of one knee many years ago.  He is not yet been evaluated by an orthopedist.  He presents for evaluation.  The patient does not have symptoms concerning for COVID-19 infection (fever, chills, cough, or new shortness of breath).    Past Medical History:  Diagnosis Date   CAD (coronary artery disease)    Dyslipidemia    Hypertension    Past Surgical History:  Procedure Laterality Date   CARDIAC CATHETERIZATION  N/A 09/03/2015   Procedure: Right/Left Heart Cath and Coronary/Graft Angiography;  Surgeon: Troy Sine, MD;  Location: [facility];  Service: Cardiovascular;  Laterality: N/A;   CORONARY ARTERY BYPASS GRAFT  02/01/1998   LIMA to LAD,SVG to diagonal,SVG to marginal, SVG to [place] MOTION  03/08/2012   old septal infarct, no reversible ischemia, PAF w/RVR w/symptomatic hypotension.   US ECHOCARDIOGRAPHY  10/19/2010   mod to severe TR,mild AI     Current Meds  Medication Sig   amiodarone (PACERONE) 200 MG tablet Take 1 tablet (200 mg total) by mouth daily.   apixaban (ELIQUIS) 2.5 MG TABS tablet Take 1 tablet (2.5 mg total) by mouth 2 (two) times daily.   atenolol (TENORMIN) 25 MG tablet TAKE ONE-HALF TABLET DAILY   cyanocobalamin 1000 MCG tablet Take 1,000 mcg by mouth daily.   Fish Oil OIL Take 1 capsule by mouth 2 (two) times daily.   levothyroxine (SYNTHROID) 50 MCG tablet Take 1 tablet (50 mcg total) by mouth every morning.   Methylcobalamin (B12-ACTIVE PO) Take 1 tablet by mouth daily.   naproxen sodium (ANAPROX) 220 MG tablet Take 220 mg by mouth 2 (two) times daily as needed (pain).   nitroGLYCERIN (NITROSTAT) 0.4 MG SL tablet Place 1 tablet (0.4 mg total) under the tongue every 5 (five) minutes as needed for chest pain (x 3 doses).   simvastatin (ZOCOR) 20 MG tablet TAKE ONE (1) TABLET BY MOUTH ONCE DAILY     Allergies:   Ramipril   Social History   Tobacco Use   Smoking status: Former Smoker   Smokeless tobacco: Current User    Types: Chew   Tobacco comment: quit smoking 50 years ago. chews occas.  Substance Use Topics   Alcohol use: No   Drug use: No    Social history is notable in that he is widowedand re-married;  has 2 children, one deceased, 5 grandchildren 8 great-grandchildren. There is no call use. He previously chewed tobacco. His wife did suffer a CVA  Family Hx: The patient's family history is  not on file.   Both parents are deceased.  He has 4 living brothers and one deceased.  ROS:   Please see the history of present illness.    No fever chills night sweats, cough, change in smell or  taste No shortness of breath No chest pain or anginal symptoms No awareness of recurrent arrhythmia Bilateral knee discomfort No recurrent presyncope or syncope Sleeping well All other systems reviewed and are negative.   Prior CV studies:   The following studies were reviewed today:  ------------------------------------------------------------------- 08/12/2018 ECHO Study Conclusions  - Left ventricle: The cavity size was normal. Wall thickness was   increased in a pattern of moderate LVH. Systolic function was   normal. The estimated ejection fraction was in the range of 55%   to 60%. Basal septal hypokinesis. The study is not technically   sufficient to allow evaluation of LV diastolic function. - Aortic valve: Calcified with moderate stenosis. Trivial   regurgitation. Mean gradient (S): 12 mm Hg. Peak gradient (S): 22   mm Hg. Valve area (VTI): 0.93 cm^2. Valve area (Vmax): 1 cm^2.   Valve area (Vmean): 1.04 cm^2. - Mitral valve: Mildly thickened leaflets . There was mild   regurgitation. - Left atrium: Moderately dilated. - Right ventricle: The cavity size was moderately dilated. Moderate   hypokinesis. - Right atrium: The atrium was mildly dilated. - Tricuspid valve: There was moderate regurgitation. - Pulmonary arteries: PA peak pressure: 28 mm Hg (S). - Inferior vena cava: The vessel was normal in size. The   respirophasic diameter changes were in the normal range (= 50%),   consistent with normal central venous pressure.  Impressions:  - Compared to a prior study in 2018, the LVEF is unchanged. There is moderate RV hypokinesis. The aortic valve is moderately   stenotic with mean gradient of 12 mmHg and AVA around 1-1.1 cm2.   Labs/Other Tests and Data Reviewed:     EKG:  An ECG dated 12/02/2018 was personally reviewed today and demonstrated:  Sinus Bradycardia 49; First degree V block; PR 214 msec  Recent Labs: 07/22/2018: ALT 8; Hemoglobin 12.3; Platelets 165; TSH 5.260 12/02/2018: BUN 17; Creatinine, Ser 1.50; Potassium 4.8; Sodium 142   Recent Lipid Panel Lab Results  Component Value Date/Time   CHOL 110 07/22/2018 09:33 AM   TRIG 104 07/22/2018 09:33 AM   HDL 38 (L) 07/22/2018 09:33 AM   CHOLHDL 2.9 07/22/2018 09:33 AM   CHOLHDL 3.7 01/11/2015 11:27 AM   LDLCALC 51 07/22/2018 09:33 AM    Wt Readings from Last 3 Encounters:  02/12/19 161 lb (73 kg)  12/02/18 169 lb 9.6 oz (76.9 kg)  10/29/18 166 lb 12.8 oz (75.7 kg)     Objective:    Vital Signs:  BP 107/60    Pulse (!) 48    Ht 5\' 8"  (1.727 m)    Wt 161 lb (73 kg)    SpO2 96%    BMI 24.48 kg/m    He states he does not know any change in his physical appearance. His breathing is normal and not labored There is no audible wheezing He denies any discomfort to his chest with palpation or any abdominal tenderness He is unaware of any leg swelling.  He denies myalgias.  He has difficulty with chronic knee pain Neurologically he denies any changes He has a normal affect and mood and normal cognition  ASSESSMENT & PLAN:    1. CAD/status post CABG surgery 1999: He underwent complex two-vessel intervention to his diagonal and circumflex graft in 2002. No recurrent anginal symptoms. 2. PAF: Currently maintaining sinus rhythm, although bradycardic.  Last documented episode was in December 2019.  He has been on amiodarone 200 mg in addition to atenolol 12.5 mg  daily.  With his heart rate at 49, I have recommended wean of atenolol over the next 4 days with discontinuance. 3. Anticoagulation: Eliquis dose was reduced at last visit due to his creatinine greater than or equ November 2020 al to 1.5 and his age.  No bleeding. 4. Aortic valve stenosis: Last echo Doppler study November 2019: Mean  gradient 12, peak gradient 22, but valve area 1.04 cm 5. Hyperlipidemia with target LDL less than 70: He continues to be on simvastatin. LDL cholesterol 51 in October 2019. 6. Knee discomfort: He will be undergoing future orthopedic evaluation.  If future surgery is necessary, I would recommend reassessment prior to giving him clearance for surgery with probable repeat echo Doppler assessment. 7. Hypothyroidism on levothyroxine  COVID-19 Education: The signs and symptoms of COVID-19 were discussed with the patient and how to seek care for testing (follow up with PCP or arrange E-visit).  The importance of social distancing was discussed today.  Time:   Today, I have spent 25 minutes with the patient with telehealth technology discussing the above problems.     Medication Adjustments/Labs and Tests Ordered: Current medicines are reviewed at length with the patient today.  Concerns regarding medicines are outlined above.   Tests Ordered: No orders of the defined types were placed in this encounter.   Medication Changes: No orders of the defined types were placed in this encounter.   Disposition:  Follow up 3 months  Signed, Shelva Majestic, MD  02/12/2019 12:24 PM    [place]

## 2019-02-12 NOTE — Patient Instructions (Signed)
Medication Instructions:  Follow instructions to come off Atenolol over 4 days.  If you need a refill on your cardiac medications before your next appointment, please call your pharmacy.   Follow-Up: At [facility], you and your health needs are our priority.  As part of our continuing mission to provide you with exceptional heart care, we have created designated Provider Care Teams.  These Care Teams include your primary Cardiologist (physician) and Advanced Practice Providers (APPs -  Physician Assistants and Nurse Practitioners) who all work together to provide you with the care you need, when you need it. You will need a follow up appointment in 3 months.  Please call our office 2 months in advance to schedule this appointment.  You may see Shelva Majestic, MD or one of the following Advanced Practice Providers on your designated Care Team: [place], [place] . Fabian Sharp, PA-C

## 2019-02-27 ENCOUNTER — Other Ambulatory Visit: Payer: Self-pay

## 2019-02-27 NOTE — Patient Outreach (Signed)
[facility]) Care Management  02/27/2019  Douglas Bray 03/11/1929 [id number]   Medication Adherence call to Douglas Bray [facility] Identifiers Verify spoke with patient he is due on Quinipril 5 mg patient explain doctor took him off this medication he is no longer taking this medication. Douglas. Witherspoon is showing past due under [place].  [place] Management Direct Dial [phone]  Fax [phone] [name]

## 2019-03-19 ENCOUNTER — Other Ambulatory Visit: Payer: Self-pay

## 2019-03-19 NOTE — Patient Outreach (Signed)
[facility]) Care Management  03/19/2019  Douglas Bray 15-Dec-1928 [id number]   Medication Adherence call to Douglas Bray Hippa Identifiers Verify patient is no longer taking Quinapril doctor took him off. [name] is showing past due under [place].   [place] Management Direct Dial [phone]  Fax [phone] [email or web address]

## 2019-03-24 DIAGNOSIS — R262 Difficulty in walking, not elsewhere classified: Secondary | ICD-10-CM | POA: Diagnosis not present

## 2019-03-24 DIAGNOSIS — E038 Other specified hypothyroidism: Secondary | ICD-10-CM | POA: Diagnosis not present

## 2019-03-24 DIAGNOSIS — I10 Essential (primary) hypertension: Secondary | ICD-10-CM | POA: Diagnosis not present

## 2019-03-24 DIAGNOSIS — I480 Paroxysmal atrial fibrillation: Secondary | ICD-10-CM | POA: Diagnosis not present

## 2019-03-24 DIAGNOSIS — L299 Pruritus, unspecified: Secondary | ICD-10-CM | POA: Diagnosis not present

## 2019-03-25 ENCOUNTER — Ambulatory Visit: Payer: Medicare Other | Admitting: Cardiovascular Disease

## 2019-03-27 DIAGNOSIS — R262 Difficulty in walking, not elsewhere classified: Secondary | ICD-10-CM | POA: Diagnosis not present

## 2019-03-31 DIAGNOSIS — Z9181 History of falling: Secondary | ICD-10-CM | POA: Diagnosis not present

## 2019-03-31 DIAGNOSIS — M25562 Pain in left knee: Secondary | ICD-10-CM | POA: Diagnosis not present

## 2019-03-31 DIAGNOSIS — M25561 Pain in right knee: Secondary | ICD-10-CM | POA: Diagnosis not present

## 2019-03-31 DIAGNOSIS — R262 Difficulty in walking, not elsewhere classified: Secondary | ICD-10-CM | POA: Diagnosis not present

## 2019-03-31 DIAGNOSIS — R296 Repeated falls: Secondary | ICD-10-CM | POA: Diagnosis not present

## 2019-04-01 DIAGNOSIS — M25561 Pain in right knee: Secondary | ICD-10-CM | POA: Diagnosis not present

## 2019-04-01 DIAGNOSIS — Z9181 History of falling: Secondary | ICD-10-CM | POA: Diagnosis not present

## 2019-04-01 DIAGNOSIS — R262 Difficulty in walking, not elsewhere classified: Secondary | ICD-10-CM | POA: Diagnosis not present

## 2019-04-01 DIAGNOSIS — R296 Repeated falls: Secondary | ICD-10-CM | POA: Diagnosis not present

## 2019-04-01 DIAGNOSIS — M25562 Pain in left knee: Secondary | ICD-10-CM | POA: Diagnosis not present

## 2019-04-03 DIAGNOSIS — M25562 Pain in left knee: Secondary | ICD-10-CM | POA: Diagnosis not present

## 2019-04-03 DIAGNOSIS — M25561 Pain in right knee: Secondary | ICD-10-CM | POA: Diagnosis not present

## 2019-04-03 DIAGNOSIS — R262 Difficulty in walking, not elsewhere classified: Secondary | ICD-10-CM | POA: Diagnosis not present

## 2019-04-03 DIAGNOSIS — R296 Repeated falls: Secondary | ICD-10-CM | POA: Diagnosis not present

## 2019-04-03 DIAGNOSIS — Z9181 History of falling: Secondary | ICD-10-CM | POA: Diagnosis not present

## 2019-04-07 NOTE — Telephone Encounter (Signed)
Opened in error

## 2019-04-10 DIAGNOSIS — R296 Repeated falls: Secondary | ICD-10-CM | POA: Diagnosis not present

## 2019-04-10 DIAGNOSIS — M25562 Pain in left knee: Secondary | ICD-10-CM | POA: Diagnosis not present

## 2019-04-10 DIAGNOSIS — Z9181 History of falling: Secondary | ICD-10-CM | POA: Diagnosis not present

## 2019-04-10 DIAGNOSIS — M25561 Pain in right knee: Secondary | ICD-10-CM | POA: Diagnosis not present

## 2019-04-10 DIAGNOSIS — R262 Difficulty in walking, not elsewhere classified: Secondary | ICD-10-CM | POA: Diagnosis not present

## 2019-04-15 DIAGNOSIS — M25562 Pain in left knee: Secondary | ICD-10-CM | POA: Diagnosis not present

## 2019-04-15 DIAGNOSIS — R262 Difficulty in walking, not elsewhere classified: Secondary | ICD-10-CM | POA: Diagnosis not present

## 2019-04-15 DIAGNOSIS — R296 Repeated falls: Secondary | ICD-10-CM | POA: Diagnosis not present

## 2019-04-15 DIAGNOSIS — M25561 Pain in right knee: Secondary | ICD-10-CM | POA: Diagnosis not present

## 2019-04-15 DIAGNOSIS — Z9181 History of falling: Secondary | ICD-10-CM | POA: Diagnosis not present

## 2019-04-17 DIAGNOSIS — M25562 Pain in left knee: Secondary | ICD-10-CM | POA: Diagnosis not present

## 2019-04-17 DIAGNOSIS — R262 Difficulty in walking, not elsewhere classified: Secondary | ICD-10-CM | POA: Diagnosis not present

## 2019-04-17 DIAGNOSIS — R296 Repeated falls: Secondary | ICD-10-CM | POA: Diagnosis not present

## 2019-04-17 DIAGNOSIS — Z9181 History of falling: Secondary | ICD-10-CM | POA: Diagnosis not present

## 2019-04-17 DIAGNOSIS — M25561 Pain in right knee: Secondary | ICD-10-CM | POA: Diagnosis not present

## 2019-04-21 DIAGNOSIS — R296 Repeated falls: Secondary | ICD-10-CM | POA: Diagnosis not present

## 2019-04-21 DIAGNOSIS — R262 Difficulty in walking, not elsewhere classified: Secondary | ICD-10-CM | POA: Diagnosis not present

## 2019-04-21 DIAGNOSIS — Z9181 History of falling: Secondary | ICD-10-CM | POA: Diagnosis not present

## 2019-04-21 DIAGNOSIS — M25562 Pain in left knee: Secondary | ICD-10-CM | POA: Diagnosis not present

## 2019-04-21 DIAGNOSIS — M25561 Pain in right knee: Secondary | ICD-10-CM | POA: Diagnosis not present

## 2019-04-29 DIAGNOSIS — R296 Repeated falls: Secondary | ICD-10-CM | POA: Diagnosis not present

## 2019-04-29 DIAGNOSIS — M25562 Pain in left knee: Secondary | ICD-10-CM | POA: Diagnosis not present

## 2019-04-29 DIAGNOSIS — Z9181 History of falling: Secondary | ICD-10-CM | POA: Diagnosis not present

## 2019-04-29 DIAGNOSIS — R262 Difficulty in walking, not elsewhere classified: Secondary | ICD-10-CM | POA: Diagnosis not present

## 2019-04-29 DIAGNOSIS — M25561 Pain in right knee: Secondary | ICD-10-CM | POA: Diagnosis not present

## 2019-05-02 DIAGNOSIS — R262 Difficulty in walking, not elsewhere classified: Secondary | ICD-10-CM | POA: Diagnosis not present

## 2019-05-02 DIAGNOSIS — L299 Pruritus, unspecified: Secondary | ICD-10-CM | POA: Diagnosis not present

## 2019-05-06 DIAGNOSIS — M25561 Pain in right knee: Secondary | ICD-10-CM | POA: Diagnosis not present

## 2019-05-06 DIAGNOSIS — Z9181 History of falling: Secondary | ICD-10-CM | POA: Diagnosis not present

## 2019-05-06 DIAGNOSIS — M25562 Pain in left knee: Secondary | ICD-10-CM | POA: Diagnosis not present

## 2019-05-06 DIAGNOSIS — R296 Repeated falls: Secondary | ICD-10-CM | POA: Diagnosis not present

## 2019-05-06 DIAGNOSIS — R262 Difficulty in walking, not elsewhere classified: Secondary | ICD-10-CM | POA: Diagnosis not present

## 2019-05-09 DIAGNOSIS — M25561 Pain in right knee: Secondary | ICD-10-CM | POA: Diagnosis not present

## 2019-05-09 DIAGNOSIS — R296 Repeated falls: Secondary | ICD-10-CM | POA: Diagnosis not present

## 2019-05-09 DIAGNOSIS — M25562 Pain in left knee: Secondary | ICD-10-CM | POA: Diagnosis not present

## 2019-05-09 DIAGNOSIS — R262 Difficulty in walking, not elsewhere classified: Secondary | ICD-10-CM | POA: Diagnosis not present

## 2019-05-09 DIAGNOSIS — Z9181 History of falling: Secondary | ICD-10-CM | POA: Diagnosis not present

## 2019-05-26 ENCOUNTER — Other Ambulatory Visit: Payer: Self-pay | Admitting: Cardiovascular Disease

## 2019-05-26 NOTE — Telephone Encounter (Signed)
66M 76.9 kg, SCr 1.5, LOV TK 02/2019

## 2019-05-27 DIAGNOSIS — G8929 Other chronic pain: Secondary | ICD-10-CM | POA: Diagnosis not present

## 2019-05-27 DIAGNOSIS — E039 Hypothyroidism, unspecified: Secondary | ICD-10-CM | POA: Diagnosis not present

## 2019-05-27 DIAGNOSIS — I10 Essential (primary) hypertension: Secondary | ICD-10-CM | POA: Diagnosis not present

## 2019-05-27 DIAGNOSIS — D649 Anemia, unspecified: Secondary | ICD-10-CM | POA: Diagnosis not present

## 2019-05-27 DIAGNOSIS — I480 Paroxysmal atrial fibrillation: Secondary | ICD-10-CM | POA: Diagnosis not present

## 2019-06-02 DIAGNOSIS — E039 Hypothyroidism, unspecified: Secondary | ICD-10-CM | POA: Diagnosis not present

## 2019-06-09 DIAGNOSIS — M1712 Unilateral primary osteoarthritis, left knee: Secondary | ICD-10-CM | POA: Diagnosis not present

## 2019-07-03 ENCOUNTER — Ambulatory Visit: Payer: Medicare Other | Admitting: Cardiovascular Disease

## 2019-07-21 DIAGNOSIS — D649 Anemia, unspecified: Secondary | ICD-10-CM | POA: Diagnosis not present

## 2019-07-21 DIAGNOSIS — R262 Difficulty in walking, not elsewhere classified: Secondary | ICD-10-CM | POA: Diagnosis not present

## 2019-07-21 DIAGNOSIS — E039 Hypothyroidism, unspecified: Secondary | ICD-10-CM | POA: Diagnosis not present

## 2019-07-21 DIAGNOSIS — I10 Essential (primary) hypertension: Secondary | ICD-10-CM | POA: Diagnosis not present

## 2019-07-21 DIAGNOSIS — I480 Paroxysmal atrial fibrillation: Secondary | ICD-10-CM | POA: Diagnosis not present

## 2019-07-23 DIAGNOSIS — R296 Repeated falls: Secondary | ICD-10-CM | POA: Diagnosis not present

## 2019-07-24 ENCOUNTER — Other Ambulatory Visit: Payer: Self-pay | Admitting: Cardiovascular Disease

## 2019-07-25 ENCOUNTER — Other Ambulatory Visit: Payer: Self-pay | Admitting: Cardiovascular Disease

## 2019-07-31 DIAGNOSIS — R531 Weakness: Secondary | ICD-10-CM | POA: Diagnosis not present

## 2019-07-31 DIAGNOSIS — Z9181 History of falling: Secondary | ICD-10-CM | POA: Diagnosis not present

## 2019-07-31 DIAGNOSIS — R262 Difficulty in walking, not elsewhere classified: Secondary | ICD-10-CM | POA: Diagnosis not present

## 2019-08-06 DIAGNOSIS — R262 Difficulty in walking, not elsewhere classified: Secondary | ICD-10-CM | POA: Diagnosis not present

## 2019-08-06 DIAGNOSIS — R531 Weakness: Secondary | ICD-10-CM | POA: Diagnosis not present

## 2019-08-06 DIAGNOSIS — M25561 Pain in right knee: Secondary | ICD-10-CM | POA: Diagnosis not present

## 2019-08-06 DIAGNOSIS — M25562 Pain in left knee: Secondary | ICD-10-CM | POA: Diagnosis not present

## 2019-08-06 DIAGNOSIS — Z9181 History of falling: Secondary | ICD-10-CM | POA: Diagnosis not present

## 2019-08-13 DIAGNOSIS — R262 Difficulty in walking, not elsewhere classified: Secondary | ICD-10-CM | POA: Diagnosis not present

## 2019-08-13 DIAGNOSIS — M25562 Pain in left knee: Secondary | ICD-10-CM | POA: Diagnosis not present

## 2019-08-13 DIAGNOSIS — Z9181 History of falling: Secondary | ICD-10-CM | POA: Diagnosis not present

## 2019-08-13 DIAGNOSIS — M25561 Pain in right knee: Secondary | ICD-10-CM | POA: Diagnosis not present

## 2019-08-13 DIAGNOSIS — R531 Weakness: Secondary | ICD-10-CM | POA: Diagnosis not present

## 2019-08-20 DIAGNOSIS — R531 Weakness: Secondary | ICD-10-CM | POA: Diagnosis not present

## 2019-08-20 DIAGNOSIS — M25562 Pain in left knee: Secondary | ICD-10-CM | POA: Diagnosis not present

## 2019-08-20 DIAGNOSIS — R262 Difficulty in walking, not elsewhere classified: Secondary | ICD-10-CM | POA: Diagnosis not present

## 2019-08-20 DIAGNOSIS — M25561 Pain in right knee: Secondary | ICD-10-CM | POA: Diagnosis not present

## 2019-08-20 DIAGNOSIS — Z9181 History of falling: Secondary | ICD-10-CM | POA: Diagnosis not present

## 2019-08-26 DIAGNOSIS — G301 Alzheimer's disease with late onset: Secondary | ICD-10-CM | POA: Diagnosis not present

## 2019-08-26 DIAGNOSIS — Z0000 Encounter for general adult medical examination without abnormal findings: Secondary | ICD-10-CM | POA: Diagnosis not present

## 2019-08-26 DIAGNOSIS — E039 Hypothyroidism, unspecified: Secondary | ICD-10-CM | POA: Diagnosis not present

## 2019-08-26 DIAGNOSIS — E782 Mixed hyperlipidemia: Secondary | ICD-10-CM | POA: Diagnosis not present

## 2019-08-26 DIAGNOSIS — Z1389 Encounter for screening for other disorder: Secondary | ICD-10-CM | POA: Diagnosis not present

## 2019-08-27 DIAGNOSIS — R262 Difficulty in walking, not elsewhere classified: Secondary | ICD-10-CM | POA: Diagnosis not present

## 2019-08-27 DIAGNOSIS — R531 Weakness: Secondary | ICD-10-CM | POA: Diagnosis not present

## 2019-08-27 DIAGNOSIS — M25562 Pain in left knee: Secondary | ICD-10-CM | POA: Diagnosis not present

## 2019-08-27 DIAGNOSIS — M25561 Pain in right knee: Secondary | ICD-10-CM | POA: Diagnosis not present

## 2019-08-27 DIAGNOSIS — Z9181 History of falling: Secondary | ICD-10-CM | POA: Diagnosis not present

## 2019-09-11 ENCOUNTER — Ambulatory Visit: Payer: Medicare Other | Admitting: Cardiovascular Disease

## 2019-09-11 ENCOUNTER — Other Ambulatory Visit: Payer: Self-pay

## 2019-09-11 ENCOUNTER — Encounter: Payer: Self-pay | Admitting: Cardiovascular Disease

## 2019-09-11 VITALS — BP 124/70 | HR 67 | Temp 97.4°F | Ht 68.0 in | Wt 153.0 lb

## 2019-09-11 DIAGNOSIS — I480 Paroxysmal atrial fibrillation: Secondary | ICD-10-CM

## 2019-09-11 DIAGNOSIS — I10 Essential (primary) hypertension: Secondary | ICD-10-CM | POA: Diagnosis not present

## 2019-09-11 DIAGNOSIS — I350 Nonrheumatic aortic (valve) stenosis: Secondary | ICD-10-CM | POA: Diagnosis not present

## 2019-09-11 DIAGNOSIS — E039 Hypothyroidism, unspecified: Secondary | ICD-10-CM

## 2019-09-11 DIAGNOSIS — I440 Atrioventricular block, first degree: Secondary | ICD-10-CM

## 2019-09-11 DIAGNOSIS — Z951 Presence of aortocoronary bypass graft: Secondary | ICD-10-CM

## 2019-09-11 DIAGNOSIS — I2510 Atherosclerotic heart disease of native coronary artery without angina pectoris: Secondary | ICD-10-CM | POA: Diagnosis not present

## 2019-09-11 NOTE — Patient Instructions (Signed)
Medication Instructions:  RESTART- Aspirin 81 mg by mouth daily  *If you need a refill on your cardiac medications before your next appointment, please call your pharmacy*  Lab Work: None Ordered  Testing/Procedures: None Ordered  Follow-Up: At [facility], you and your health needs are our priority.  As part of our continuing mission to provide you with exceptional heart care, we have created designated Provider Care Teams.  These Care Teams include your primary Cardiologist (physician) and Advanced Practice Providers (APPs -  Physician Assistants and Nurse Practitioners) who all work together to provide you with the care you need, when you need it.  Your next appointment:   6 month(s)  The format for your next appointment:   In Person  Provider:   Shelva Majestic, MD

## 2019-09-12 NOTE — Progress Notes (Signed)
Patient ID: Douglas Bray, male   DOB: Apr 29, 1929, 85 y.o.   MRN: [id number]     HPI: Douglas Bray is a 85 y.o. male who presents for an 7 month cardiology evaluation.  Douglas Bray has known CAD and in April 1999 underwent CABG revascularization surgery LIMA to the LAD, vein to the diagonal, vein to the marginal, and vein to the PDA as well as optional diagonal vessel. In April 2002 he underwent complex 2 vessel intervention to the diagonal vessel with stenting as well as stenting to the circumflex graft. In January 2012 echo Doppler study suggested normal systolic function with diastolic dysfunction. He had moderate to severe tricuspid regurgitation with mild pulmonary hypertension, mild aortic insufficiency, mild mitral regurgitation.  In May 2013 due to episodes of left arm discomfort a nuclear perfusion study was performed which continued to show normal perfusion.  An echo Doppler study on 05/14/2013  revealed mild LVH with normal systolic function. Diastolic parameters were normal. There was very mild aortic stenosis with mildly thickened trileaflet valve with mild AR. He had mild mitral regurgitation, moderately severe tricuspid regurgitation and mild pulmonary hypertension with a PA pressure 37 mm. His mean aortic gradient was 7 with a peak gradient of 16.  Additional problems include hypertension and hyperlipidemia.  Since I last saw him, he underwent a 2-D echo Doppler study in April 2016 which showed an ejection fraction of 55-60% with mild concentric LVH and grade 2 diastolic dysfunction.  His mean gradient across his aortic valve was 9 with a peak gradient of 17 and his aortic valve area range from 1.7-2.04 cm.  A nuclear perfusion study revealed normal perfusion.  In 2016 he was working hard and typically 12-14 hours a day.  He admits to increasing fatigue but denies chest pain or shortness of breath.  He is not resting well.  He is unaware of palpitations.  He denies PND,  orthopnea.  When I last saw him, he had developed new deep T-wave inversion inferiorly, which had not been present April 2016 at the time of his stress test.  Plavix was added to his medical rate regimen and nitrates were increased.  On 08/29/2015.  He underwent right and left heart cardiac catheterization.  He normal LV function with an EF of 55%.  His right heart pressures were normal.  There was very minimal aortic valve stenosis.  He has severe native CAD with total occlusion of the very proximal LAD after a small septal perforating artery, total occlusion of the very proximal left circumflex vessel after small OM vessel and total occlusion of the proximal RCA.  He is a patent LIMA graft supplying the mid LAD.  There was a pain, vein graft supplying the distal circumflex marginal with the previously placed stent in the midportion of the graft with intimal hyperplasia.  Narrowing of 30-40% and 50% somewhat eccentric narrowing in the more distal portion of the graft.  He had septal collaterals to the proximal LAD from the distal OM branch.  Vein graft which probably had previously supplied the optional diagonal vessel.  There was a patent vein graft to the diagonal vessel, which with previously placed stent that is widely patent.  In the midportion of the graft.  The vein graft supplying the PDA and a dominant RCA system was patent.  Medical therapy was recommended.  [place] after his wife of 64 years, 2 months and 15 days had passed away.  He 2023-08-24  2017, he re-married his wife's best friend.  When I last saw him, I recommended he undergo a follow-up echo Doppler study.  As was done on 01/05/2017 and essentially was unchanged from previously.  He had normal EF at 55-60%, mild LVH, grade 2 diastolic dysfunction, and mild aortic valve stenosis with mild AR.  There was mild LA dilatation and mild pulmonary hypertension.  His mean aortic gradient was 12 mm with a peak gradient of 22  mm.  He had follow-up laboratory which reconfirmed mild TSH elevation which had increased 8.95.  I called in levothyroxine initially at 25 g which was titrated to 50 ug.  When I saw him in May 2019 he was doing well and denied any chest pain, PND orthopnea, presyncope or syncope.   He had some arthritic issues.  Laboratory in October 2018 showed a total cholesterol 128, LDL 59.  Triglycerides are 158.  He had normal renal function.    When I saw him in December 2019he hadnot been as active as he had in the past. He deniedany episodes of chest pressure. He deniedany episodes of tachycardia. He had undergone an echo Doppler study in November 2019 which showed normal LV function with an EF of 55 to 60%. His aortic stenosis had progressed and was now felt to be moderate with a mean gradient of 12, peak gradient of 22, and valve area of 1.1 cm. He deniedany presyncope or syncope. When I saw him in December 2019 his ECG showed new atrial fibrillation. He was unaware of this arrhythmia. I recommended discontinuance of Plavix and started him on Eliquis 5 mg twice a day. Initiated amiodarone 200 mg daily in attempt to restore sinus rhythm. He was seen in follow-up on October 14, 2018 by Douglas Bray after he had experienced a syncopal spell where he had fallen and hit his head sustaining a laceration. He had been hospitalized at [facility] overnight. During that visit he was found to be orthostatic, quinapril was discontinued and his amiodarone was increased to 300 mg daily. He again saw Douglas Bray follow-up on October 29, 2018.  When I saw him in in February 2020, he was unaware of any sense of irregular heart rhythm. Meds included amiodarone 300 mg daily, Eliquis 5 mg twice a day, aspirin 81 mg, atenolol 12.5 mg daily in addition to levothyroxine 50 mcg and simvastatin 40 mg.   During that evaluation, he was bradycardic and his ECG showed sinus bradycardia at  49 bpm with first-degree AV. I recommended dose reduction of amiodarone back down to 200 mg daily. His Eliquis dose was reduced to 2.5 mg twice a day due to renal insufficiency and his age.  He was last evaluated on Feb 12, 2019 and a telemedicine visit.  At that time he had remained stable and deniedepisodes of chest pain or shortness of breath.  Heart rate continues to be low.  He denied any episodes of presyncope or syncope or awareness of recurrent atrial fibrillation.  He tells me in the future he may need to have a knee operation.  He had an operation of one knee many years ago.    Since I last saw him, he has done well cardiovascularly.  Apparently he was taken off Eliquis by Dr. Reesa Chew in October he continues to have knee discomfort, had seen an orthopedist but does not need surgery.  He has had some issues with short-term memory loss.  He denies chest pain, presyncope or syncope.  He denies  any exertional dyspnea.  He presents for reevaluation  Allergies  Allergen Reactions  . Ramipril Cough    Current Outpatient Medications  Medication Sig Dispense Refill  . amiodarone (PACERONE) 200 MG tablet TAKE ONE (1) TABLET ONCE DAILY 30 tablet 6  . cyanocobalamin 1000 MCG tablet Take 1,000 mcg by mouth daily.    Marland Kitchen donepezil (ARICEPT) 5 MG tablet Take 5 mg by mouth at bedtime.    . fexofenadine (ALLEGRA) 60 MG tablet Take 60 mg by mouth 2 (two) times daily.    . Fish Oil OIL Take 1 capsule by mouth 2 (two) times daily.    Marland Kitchen levothyroxine (SYNTHROID) 50 MCG tablet Take 1 tablet (50 mcg total) by mouth every morning. 90 tablet 1  . nitroGLYCERIN (NITROSTAT) 0.4 MG SL tablet Place 1 tablet (0.4 mg total) under the tongue every 5 (five) minutes as needed for chest pain (x 3 doses). 30 tablet 1  . simvastatin (ZOCOR) 20 MG tablet TAKE ONE (1) TABLET ONCE DAILY 90 tablet 1  . traMADol (ULTRAM) 50 MG tablet Take by mouth every 6 (six) hours as needed.     No current facility-administered medications for  this visit.     Social History   Socioeconomic History  . Marital status: Widowed    Spouse name: Not on file  . Number of children: Not on file  . Years of education: Not on file  . Highest education level: Not on file  Occupational History  . Not on file  Social Needs  . Financial resource strain: Not on file  . Food insecurity    Worry: Not on file    Inability: Not on file  . Transportation needs    Medical: Not on file    Non-medical: Not on file  Tobacco Use  . Smoking status: Former Research scientist (life sciences)  . Smokeless tobacco: Current User    Types: Chew  . Tobacco comment: quit smoking 50 years ago. chews occas.  Substance and Sexual Activity  . Alcohol use: No  . Drug use: No  . Sexual activity: Not on file  Lifestyle  . Physical activity    Days per week: Not on file    Minutes per session: Not on file  . Stress: Not on file  Relationships  . Social Herbalist on phone: Not on file    Gets together: Not on file    Attends religious service: Not on file    Active member of club or organization: Not on file    Attends meetings of clubs or organizations: Not on file    Relationship status: Not on file  . Intimate partner violence    Fear of current or ex partner: Not on file    Emotionally abused: Not on file    Physically abused: Not on file    Forced sexual activity: Not on file  Other Topics Concern  . Not on file  Social History Narrative  . Not on file   Social history is notable in that he is widowedand re-married;  has 2 children, one deceased, 5 grandchildren 8 great-grandchildren. There is no call use. He previously chewed tobacco. His wife did suffer a CVA.  History reviewed. No pertinent family history.  Both parents are deceased.  He has 4 living brothers and one deceased.  ROS General: Negative; No fevers, chills, or night sweats;  HEENT: Negative; No changes in vision or hearing, sinus congestion, difficulty swallowing Pulmonary: Negative;  No cough, wheezing,  shortness of breath, hemoptysis Cardiovascular: Negative; No chest pain, presyncope, syncope, palpatations GI: Negative; No nausea, vomiting, diarrhea, or abdominal pain GU: Negative; No dysuria, hematuria, or difficulty voiding Musculoskeletal: Positive for bilateral knee discomfort Hematologic/Oncology: Negative; no easy bruising, bleeding Endocrine: Negative; no heat/cold intolerance; no diabetes Neuro: Short-term memory loss Skin: Negative; No rashes or skin lesions Psychiatric: Negative; No behavioral problems, depression Sleep: Negative; No snoring, daytime sleepiness, hypersomnolence, bruxism, restless legs, hypnogognic hallucinations, no cataplexy Other comprehensive 14 point system review is negative.   PE BP 124/70 (BP Location: Left Arm, Patient Position: Sitting, Cuff Size: Normal)   Pulse 67   Temp (!) 97.4 F (36.3 C)   Ht '5\' 8"'  (1.727 m)   Wt 153 lb (69.4 kg)   BMI 23.26 kg/m    Repeat blood pressure by me 120/66  Wt Readings from Last 3 Encounters:  09/11/19 153 lb (69.4 kg)  02/12/19 161 lb (73 kg)  12/02/18 169 lb 9.6 oz (76.9 kg)   General: Alert, oriented, no distress.  Skin: normal turgor, no rashes, warm and dry HEENT: Normocephalic, atraumatic. Pupils equal round and reactive to light; sclera anicteric; extraocular muscles intact; Nose without nasal septal hypertrophy Mouth/Parynx benign; Mallinpatti scale 2 Neck: No JVD, no carotid bruits; normal carotid upstroke Lungs: clear to ausculatation and percussion; no wheezing or rales Chest wall: without tenderness to palpitation Heart: PMI not displaced, RRR, s1 s2 normal, 2/6 systolic murmur in the aortic area, mid peaking;, no diastolic murmur, no rubs, gallops, thrills, or heaves Abdomen: soft, nontender; no hepatosplenomehaly, BS+; abdominal aorta nontender and not dilated by palpation. Back: no CVA tenderness Pulses 2+ Musculoskeletal: full range of motion, normal strength, no  joint deformities Extremities: no clubbing cyanosis or edema, Homan's sign negative  Neurologic: grossly nonfocal; Cranial nerves grossly wnl Psychologic: Normal mood and affect   ECG (independently read by me): Sinus rhythm at 67 bpm with first-degree AV block, LVH with repolarization changes.  QTc interval 477 ms  February 2020 ECG (independently read by me): Sinus bradycardia at 49 bpm, first-degree AV block, PR interval 214 ms  December 2019 ECG (independently read by me): Atrial fibrillation with a ventricular rate in the 70s.  Poor anterior R wave progression.  QTc interval 436 ms.  May 2019 ECG (independently read by me): Sinus bradycardia with first-degree AV block.  PR interval 212 ms.  No ST segment changes.  October 2018 ECG (independently read by me): Sinus bradycardia at 52 bpm.  First-degree AV block with a PR interval of 214 ms.  Borderline voltage criteria for LVH.  QTc interval 403 ms.  No significant ST-T changes.  March 2018 ECG (independently read by me): Sinus bradycardia 57 bpm.  LVH by voltage criteria.  Nonspecific ST changes.  Normal intervals.  August 2017 ECG (independently read by me): Normal sinus rhythm at 60 bpm.  LVH by voltage criteria.  Previous deep T-wave inversion inferiorly.  Is no longer present.  ECG (independently read by me): Sinus bradycardia 53 bpm with PAC.  New significant T-wave inversion in leads 3 and aVF which was not present on his April 2016 and prior 2015 ECGs.  April 2016 ECG (independently read by me): Sinus bradycardia 50 bpm.  Poor progression V1 through V3.  No significant ST segment changes  May 2015 ECG (independently read by me): Sinus bradycardia 53 beats per minute.  PR interval 176 ms, QTc interval normal at 470 ms.  No significant ST-T changes.  Prior December 2014 ECG: Sinus rhythm  at 53 beats per minute. No ectopy. Normal intervals.  LABS:  BMP Latest Ref Rng & Units 12/02/2018 07/22/2018 12/15/2016  Glucose 65 - 99 mg/dL  76 100(H) 92  BUN 8 - 27 mg/dL '17 13 16  ' Creatinine 0.76 - 1.27 mg/dL 1.50(H) 1.21 1.46(H)  BUN/Creat Ratio 10 - '24 11 11 11  ' Sodium 134 - 144 mmol/L 142 141 141  Potassium 3.5 - 5.2 mmol/L 4.8 4.6 4.8  Chloride 96 - 106 mmol/L 102 104 101  CO2 20 - 29 mmol/L '25 26 25  ' Calcium 8.6 - 10.2 mg/dL 9.4 9.2 9.7   Hepatic Function Latest Ref Rng & Units 07/22/2018 12/15/2016 01/11/2015  Total Protein 6.0 - 8.5 g/dL 6.6 6.8 7.1  Albumin 3.5 - 4.7 g/dL 4.0 4.0 4.3  AST 0 - 40 IU/L '14 20 20  ' ALT 0 - 44 IU/L '8 11 14  ' Alk Phosphatase 39 - 117 IU/L 49 42 38(L)  Total Bilirubin 0.0 - 1.2 mg/dL 0.8 0.6 0.8   CBC Latest Ref Rng & Units 07/22/2018 12/15/2016 08/31/2015  WBC 3.4 - 10.8 x10E3/uL 6.8 6.3 7.9  Hemoglobin 13.0 - 17.7 g/dL 12.3(L) 13.5 13.2  Hematocrit 37.5 - 51.0 % 35.9(L) 40.4 38.5(L)  Platelets 150 - 450 x10E3/uL 165 185 204   Lab Results  Component Value Date   MCV 95 07/22/2018   MCV 97 12/15/2016   MCV 94.4 08/31/2015   Lab Results  Component Value Date   TSH 5.260 (H) 07/22/2018   No results found for: HGBA1C   Lipid Panel     Component Value Date/Time   CHOL 110 07/22/2018 0933   TRIG 104 07/22/2018 0933   HDL 38 (L) 07/22/2018 0933   CHOLHDL 2.9 07/22/2018 0933   CHOLHDL 3.7 01/11/2015 1127   VLDL 36 01/11/2015 1127   LDLCALC 51 07/22/2018 0933   RADIOLOGY: No results found.  IMPRESSION:  1. Coronary artery disease involving native coronary artery of native heart without angina pectoris   2. Hx of CABG   3. Moderate aortic stenosis   4. Essential hypertension   5. Paroxysmal atrial fibrillation (HCC)   6. First degree heart block   7. Hypothyroidism, unspecified type     ASSESSMENT AND PLAN: Mr. CAGNEY DEGRACE is an active 85 year old gentleman who is status post CABG revascularization surgery In 1999.  His last nuclear perfusion study in April 2016 continued to show fairly normal perfusion without ischemia.  On his echo in August 2014 he had very mild  aortic stenosis with  normal systolic function. In 2016 he was found to have deep T-wave inversion inferiorly and I performed right and left heart cardiac catheterization which revealed severe native CAD with total occlusion of the very proximal LAD after small septal perforating artery, total occlusion of a proximal left circumflex vessel after small OM vessel, total occlusion of the proximal RCA.  His LIMA graft was widely patent as was the graft supplying the distal circumflex marginal with the previously placed stent in the midportion of the graft with intimal hyperplasia.  He had a patent vein graft to the diagonal vessel the previously placed stent that was widely patent.  The vein graft supplying the PDA remain patent.  He continues to be asymptomatic without recurrent anginal symptoms on medical therapy.  PE reveals a 2/6 systolic murmur that is mid peaking suggestive of aortic stenosis.  His echo Doppler study from November 2019 showed an EF of 55 to 60%.  He again had a  mean gradient of 12, peak gradient of 22, similar to his March 2018 study.  On his November 2019 study, aortic valve area was 1.1 cm suggestive of moderate aortic stenosis.  On exam today, blood pressure remained stable at 120/60.  He continues to be on amiodarone and has not had any episodes of recurrent AF.  In the past I had taken him off Plavix and recommended Eliquis in the setting of his A. fib.Marland Kitchen  However, due to fall risk his primary physician discontinued Eliquis in October 2020.  I have recommended institution of aspirin 81 mg.  He continues to be on simvastatin 20 mg for hyperlipidemia.  LDL cholesterol in October 2019 was 51.  He continues to be on levothyroxine for hypothyroidism.  He continues to have bilateral knee discomfort but does not need surgery.  I will see him in 6 months for reevaluation or sooner if problems arise.  Time spent: 25 minutes  Troy Sine, MD, [facility]  09/16/2019 6:48 PM

## 2019-09-16 ENCOUNTER — Encounter: Payer: Self-pay | Admitting: Cardiovascular Disease

## 2019-09-17 ENCOUNTER — Other Ambulatory Visit: Payer: Self-pay

## 2019-09-17 DIAGNOSIS — Z951 Presence of aortocoronary bypass graft: Secondary | ICD-10-CM

## 2019-09-17 DIAGNOSIS — R001 Bradycardia, unspecified: Secondary | ICD-10-CM

## 2019-09-17 DIAGNOSIS — I350 Nonrheumatic aortic (valve) stenosis: Secondary | ICD-10-CM

## 2019-09-17 DIAGNOSIS — I440 Atrioventricular block, first degree: Secondary | ICD-10-CM

## 2019-09-17 DIAGNOSIS — I2510 Atherosclerotic heart disease of native coronary artery without angina pectoris: Secondary | ICD-10-CM

## 2019-09-17 DIAGNOSIS — I10 Essential (primary) hypertension: Secondary | ICD-10-CM

## 2019-09-17 DIAGNOSIS — I480 Paroxysmal atrial fibrillation: Secondary | ICD-10-CM

## 2019-09-17 DIAGNOSIS — E039 Hypothyroidism, unspecified: Secondary | ICD-10-CM

## 2019-10-27 DIAGNOSIS — E039 Hypothyroidism, unspecified: Secondary | ICD-10-CM | POA: Diagnosis not present

## 2019-10-27 DIAGNOSIS — I10 Essential (primary) hypertension: Secondary | ICD-10-CM | POA: Diagnosis not present

## 2019-10-27 DIAGNOSIS — M25569 Pain in unspecified knee: Secondary | ICD-10-CM | POA: Diagnosis not present

## 2019-10-27 DIAGNOSIS — R262 Difficulty in walking, not elsewhere classified: Secondary | ICD-10-CM | POA: Diagnosis not present

## 2019-10-27 DIAGNOSIS — I480 Paroxysmal atrial fibrillation: Secondary | ICD-10-CM | POA: Diagnosis not present

## 2019-11-11 DIAGNOSIS — L219 Seborrheic dermatitis, unspecified: Secondary | ICD-10-CM | POA: Diagnosis not present

## 2019-11-11 DIAGNOSIS — L821 Other seborrheic keratosis: Secondary | ICD-10-CM | POA: Diagnosis not present

## 2019-11-11 DIAGNOSIS — L570 Actinic keratosis: Secondary | ICD-10-CM | POA: Diagnosis not present

## 2019-11-11 DIAGNOSIS — L299 Pruritus, unspecified: Secondary | ICD-10-CM | POA: Diagnosis not present

## 2019-12-05 DIAGNOSIS — E039 Hypothyroidism, unspecified: Secondary | ICD-10-CM | POA: Diagnosis not present

## 2019-12-05 DIAGNOSIS — N1831 Chronic kidney disease, stage 3a: Secondary | ICD-10-CM | POA: Diagnosis not present

## 2019-12-05 DIAGNOSIS — E539 Vitamin B deficiency, unspecified: Secondary | ICD-10-CM | POA: Diagnosis not present

## 2019-12-05 DIAGNOSIS — I10 Essential (primary) hypertension: Secondary | ICD-10-CM | POA: Diagnosis not present

## 2019-12-05 DIAGNOSIS — I480 Paroxysmal atrial fibrillation: Secondary | ICD-10-CM | POA: Diagnosis not present

## 2020-01-28 DIAGNOSIS — M25562 Pain in left knee: Secondary | ICD-10-CM | POA: Diagnosis not present

## 2020-01-28 DIAGNOSIS — I10 Essential (primary) hypertension: Secondary | ICD-10-CM | POA: Diagnosis not present

## 2020-01-28 DIAGNOSIS — N1831 Chronic kidney disease, stage 3a: Secondary | ICD-10-CM | POA: Diagnosis not present

## 2020-01-28 DIAGNOSIS — I480 Paroxysmal atrial fibrillation: Secondary | ICD-10-CM | POA: Diagnosis not present

## 2020-01-28 DIAGNOSIS — E539 Vitamin B deficiency, unspecified: Secondary | ICD-10-CM | POA: Diagnosis not present

## 2020-02-03 ENCOUNTER — Other Ambulatory Visit: Payer: Self-pay | Admitting: Cardiovascular Disease

## 2020-02-28 ENCOUNTER — Other Ambulatory Visit: Payer: Self-pay | Admitting: Cardiovascular Disease

## 2020-03-04 ENCOUNTER — Other Ambulatory Visit: Payer: Self-pay | Admitting: Cardiovascular Disease

## 2020-03-04 NOTE — Telephone Encounter (Signed)
New Message   *STAT* If patient is at the pharmacy, call can be transferred to refill team.   1. Which medications need to be refilled? (please list name of each medication and dose if known) amiodarone (PACERONE) 200 MG tablet  2. Which pharmacy/location (including street and city if local pharmacy) is medication to be sent to? [place], [place]  3. Do they need a 30 day or 90 day supply? 90 day supply

## 2020-03-12 ENCOUNTER — Telehealth: Payer: Medicare Other | Admitting: Cardiovascular Disease

## 2020-04-28 DIAGNOSIS — I10 Essential (primary) hypertension: Secondary | ICD-10-CM | POA: Diagnosis not present

## 2020-04-28 DIAGNOSIS — E539 Vitamin B deficiency, unspecified: Secondary | ICD-10-CM | POA: Diagnosis not present

## 2020-04-28 DIAGNOSIS — I480 Paroxysmal atrial fibrillation: Secondary | ICD-10-CM | POA: Diagnosis not present

## 2020-04-28 DIAGNOSIS — N1831 Chronic kidney disease, stage 3a: Secondary | ICD-10-CM | POA: Diagnosis not present

## 2020-04-28 DIAGNOSIS — E039 Hypothyroidism, unspecified: Secondary | ICD-10-CM | POA: Diagnosis not present

## 2020-05-04 DIAGNOSIS — G319 Degenerative disease of nervous system, unspecified: Secondary | ICD-10-CM | POA: Diagnosis not present

## 2020-05-04 DIAGNOSIS — R0902 Hypoxemia: Secondary | ICD-10-CM | POA: Diagnosis not present

## 2020-05-04 DIAGNOSIS — W19XXXA Unspecified fall, initial encounter: Secondary | ICD-10-CM | POA: Diagnosis not present

## 2020-05-04 DIAGNOSIS — R58 Hemorrhage, not elsewhere classified: Secondary | ICD-10-CM | POA: Diagnosis not present

## 2020-05-04 DIAGNOSIS — Z955 Presence of coronary angioplasty implant and graft: Secondary | ICD-10-CM | POA: Diagnosis not present

## 2020-05-04 DIAGNOSIS — Z7901 Long term (current) use of anticoagulants: Secondary | ICD-10-CM | POA: Diagnosis not present

## 2020-05-04 DIAGNOSIS — S0003XA Contusion of scalp, initial encounter: Secondary | ICD-10-CM | POA: Diagnosis not present

## 2020-05-04 DIAGNOSIS — Z743 Need for continuous supervision: Secondary | ICD-10-CM | POA: Diagnosis not present

## 2020-05-04 DIAGNOSIS — Z79899 Other long term (current) drug therapy: Secondary | ICD-10-CM | POA: Diagnosis not present

## 2020-05-04 DIAGNOSIS — I6389 Other cerebral infarction: Secondary | ICD-10-CM | POA: Diagnosis not present

## 2020-05-04 DIAGNOSIS — I4891 Unspecified atrial fibrillation: Secondary | ICD-10-CM | POA: Diagnosis not present

## 2020-05-04 DIAGNOSIS — E039 Hypothyroidism, unspecified: Secondary | ICD-10-CM | POA: Diagnosis not present

## 2020-05-04 DIAGNOSIS — I10 Essential (primary) hypertension: Secondary | ICD-10-CM | POA: Diagnosis not present

## 2020-05-04 DIAGNOSIS — I6782 Cerebral ischemia: Secondary | ICD-10-CM | POA: Diagnosis not present

## 2020-05-05 ENCOUNTER — Other Ambulatory Visit: Payer: Self-pay

## 2020-05-05 ENCOUNTER — Encounter: Payer: Self-pay | Admitting: Cardiovascular Disease

## 2020-05-05 ENCOUNTER — Ambulatory Visit: Payer: Medicare Other | Admitting: Cardiovascular Disease

## 2020-05-05 VITALS — BP 106/50 | HR 53 | Ht 68.0 in | Wt 156.6 lb

## 2020-05-05 DIAGNOSIS — I350 Nonrheumatic aortic (valve) stenosis: Secondary | ICD-10-CM

## 2020-05-05 DIAGNOSIS — E039 Hypothyroidism, unspecified: Secondary | ICD-10-CM

## 2020-05-05 DIAGNOSIS — I10 Essential (primary) hypertension: Secondary | ICD-10-CM | POA: Diagnosis not present

## 2020-05-05 DIAGNOSIS — I2510 Atherosclerotic heart disease of native coronary artery without angina pectoris: Secondary | ICD-10-CM | POA: Diagnosis not present

## 2020-05-05 DIAGNOSIS — I480 Paroxysmal atrial fibrillation: Secondary | ICD-10-CM | POA: Diagnosis not present

## 2020-05-05 DIAGNOSIS — Z951 Presence of aortocoronary bypass graft: Secondary | ICD-10-CM

## 2020-05-05 DIAGNOSIS — E785 Hyperlipidemia, unspecified: Secondary | ICD-10-CM

## 2020-05-05 NOTE — Patient Instructions (Signed)
Medication Instructions:  BEGIN TAKING YOUR LASIX EVERY OTHER DAY, THEN EVERY 3 DAYS, THEN EVERY 4 DAYS, THEN CHANGE TO AS NEEDED UNTIL THE SWELLING IS GONE  *If you need a refill on your cardiac medications before your next appointment, please call your pharmacy*  Testing/Procedures:2 MONTHS Your physician has requested that you have an echocardiogram. Echocardiography is a painless test that uses sound waves to create images of your heart. It provides your doctor with information about the size and shape of your heart and how well your heart's chambers and valves are working. This procedure takes approximately one hour. There are no restrictions for this procedure.    Follow-Up: At [facility], you and your health needs are our priority.  As part of our continuing mission to provide you with exceptional heart care, we have created designated Provider Care Teams.  These Care Teams include your primary Cardiologist (physician) and Advanced Practice Providers (APPs -  Physician Assistants and Nurse Practitioners) who all work together to provide you with the care you need, when you need it.  We recommend signing up for the patient portal called "MyChart".  Sign up information is provided on this After Visit Summary.  MyChart is used to connect with patients for Virtual Visits (Telemedicine).  Patients are able to view lab/test results, encounter notes, upcoming appointments, etc.  Non-urgent messages can be sent to your provider as well.   To learn more about what you can do with MyChart, go to [email or web address].    Your next appointment:   3 month(s)  The format for your next appointment:   In Person  Provider:   Shelva Majestic, MD   Other Instructions COMPRESSION STOCKINGS 20-30MM

## 2020-05-05 NOTE — Progress Notes (Signed)
Patient ID: Douglas Bray, male   DOB: Nov 25, 1928, 86 y.o.   MRN: [id number]     HPI: Douglas Bray is a 86 y.o. male who presents for an 7 month cardiology evaluation.  Douglas Bray has known CAD and in April 1999 underwent CABG revascularization surgery LIMA to the LAD, vein to the diagonal, vein to the marginal, and vein to the PDA as well as optional diagonal vessel. In April 2002 he underwent complex 2 vessel intervention to the diagonal vessel with stenting as well as stenting to the circumflex graft. In January 2012 echo Doppler study suggested normal systolic function with diastolic dysfunction. He had moderate to severe tricuspid regurgitation with mild pulmonary hypertension, mild aortic insufficiency, mild mitral regurgitation.  In May 2013 due to episodes of left arm discomfort a nuclear perfusion study was performed which continued to show normal perfusion.  An echo Doppler study on 05/14/2013  revealed mild LVH with normal systolic function. Diastolic parameters were normal. There was very mild aortic stenosis with mildly thickened trileaflet valve with mild AR. He had mild mitral regurgitation, moderately severe tricuspid regurgitation and mild pulmonary hypertension with a PA pressure 37 mm. His mean aortic gradient was 7 with a peak gradient of 16.  Additional problems include hypertension and hyperlipidemia.  Since I last saw him, he underwent a 2-D echo Doppler study in April 2016 which showed an ejection fraction of 55-60% with mild concentric LVH and grade 2 diastolic dysfunction.  His mean gradient across his aortic valve was 9 with a peak gradient of 17 and his aortic valve area range from 1.7-2.04 cm.  A nuclear perfusion study revealed normal perfusion.  In 2016 he was working hard and typically 12-14 hours a day.  He admits to increasing fatigue but denies chest pain or shortness of breath.  He is not resting well.  He is unaware of palpitations.  He denies PND,  orthopnea.  When I last saw him, he had developed new deep T-wave inversion inferiorly, which had not been present April 2016 at the time of his stress test.  Plavix was added to his medical rate regimen and nitrates were increased.  On 08/29/2015.  He underwent right and left heart cardiac catheterization.  He normal LV function with an EF of 55%.  His right heart pressures were normal.  There was very minimal aortic valve stenosis.  He has severe native CAD with total occlusion of the very proximal LAD after a small septal perforating artery, total occlusion of the very proximal left circumflex vessel after small OM vessel and total occlusion of the proximal RCA.  He is a patent LIMA graft supplying the mid LAD.  There was a pain, vein graft supplying the distal circumflex marginal with the previously placed stent in the midportion of the graft with intimal hyperplasia.  Narrowing of 30-40% and 50% somewhat eccentric narrowing in the more distal portion of the graft.  He had septal collaterals to the proximal LAD from the distal OM branch.  Vein graft which probably had previously supplied the optional diagonal vessel.  There was a patent vein graft to the diagonal vessel, which with previously placed stent that is widely patent.  In the midportion of the graft.  The vein graft supplying the PDA and a dominant RCA system was patent.  Medical therapy was recommended.  [place] after his wife of 27 years, 2 months and 15 days had passed away.  He 08-24-2023  2017, he re-married his wife's best friend.  When I last saw him, I recommended he undergo a follow-up echo Doppler study.  As was done on 01/05/2017 and essentially was unchanged from previously.  He had normal EF at 55-60%, mild LVH, grade 2 diastolic dysfunction, and mild aortic valve stenosis with mild AR.  There was mild LA dilatation and mild pulmonary hypertension.  His mean aortic gradient was 12 mm with a peak gradient of 22  mm.  He had follow-up laboratory which reconfirmed mild TSH elevation which had increased 8.95.  I called in levothyroxine initially at 25 g which was titrated to 50 ug.  When I saw him in May 2019 he was doing well and denied any chest pain, PND orthopnea, presyncope or syncope.   He had some arthritic issues.  Laboratory in October 2018 showed a total cholesterol 128, LDL 59.  Triglycerides are 158.  He had normal renal function.    When I saw him in December 2019he hadnot been as active as he had in the past. He deniedany episodes of chest pressure. He deniedany episodes of tachycardia. He had undergone an echo Doppler study in November 2019 which showed normal LV function with an EF of 55 to 60%. His aortic stenosis had progressed and was now felt to be moderate with a mean gradient of 12, peak gradient of 22, and valve area of 1.1 cm. He deniedany presyncope or syncope. When I saw him in December 2019 his ECG showed new atrial fibrillation. He was unaware of this arrhythmia. I recommended discontinuance of Plavix and started him on Eliquis 5 mg twice a day. Initiated amiodarone 200 mg daily in attempt to restore sinus rhythm. He was seen in follow-up on October 14, 2018 by Douglas Bray after he had experienced a syncopal spell where he had fallen and hit his head sustaining a laceration. He had been hospitalized at [facility]. During that visit he was found to be orthostatic, quinapril was discontinued and his amiodarone was increased to 300 mg daily. He again saw Douglas Bray follow-up on October 29, 2018.  When I saw him in in February 2020, he was unaware of any sense of irregular heart rhythm. Meds included amiodarone 300 mg daily, Eliquis 5 mg twice a day, aspirin 81 mg, atenolol 12.5 mg daily in addition to levothyroxine 50 mcg and simvastatin 40 mg.   During that evaluation, he was bradycardic and his ECG showed sinus bradycardia at  49 bpm with first-degree AV. I recommended dose reduction of amiodarone back down to 200 mg daily. His Eliquis dose was reduced to 2.5 mg twice a day due to renal insufficiency and his age.  He was evaluated on Feb 12, 2019 in a telemedicine visit.  At that time he had remained stable and deniedepisodes of chest pain or shortness of breath.  Heart rate continues to be low.  He denied any episodes of presyncope or syncope or awareness of recurrent atrial fibrillation.  He tells me in the future he may need to have a knee operation.  He had an operation of one knee many years ago.    I last saw him in December 2021 and since his prior evaluation he had done well from a cardiovascular standpoint.  Apparently due to fall risk he was taken off Eliquis by Dr. Reesa Chew in October 2020.  He continued to have knee discomfort and had seen an orthopedist but does not need surgery.  He has had some  issues with short-term memory loss.  He denied chest pain, presyncope or syncope.  He denied any exertional dyspnea.   Since I last saw him, he has continued to experience lower extremity edema.  He is walking with a walker.  He has experienced bilateral knee discomfort.  Yesterday he had fallen and went to the ER in [place] and was found to have a hematoma on the back of his head.  Head CT was negative for bleed.  He denies any anginal symptoms.  He has a prescription for furosemide which he was given by his primary physician due to leg swelling but he has rarely taken this since it makes him urinate.  He continues to be on levothyroxine for hypothyroidism and simvastatin for hyperlipidemia.  He presents for evaluation.  Allergies  Allergen Reactions  . Ramipril Cough    Current Outpatient Medications  Medication Sig Dispense Refill  . amiodarone (PACERONE) 200 MG tablet TAKE ONE (1) TABLET ONCE DAILY 90 tablet 1  . aspirin 81 MG chewable tablet Chew by mouth daily.    . cyanocobalamin 1000 MCG tablet Take 1,000 mcg by  mouth daily.    Marland Kitchen donepezil (ARICEPT) 5 MG tablet Take 5 mg by mouth at bedtime.    . fexofenadine (ALLEGRA) 60 MG tablet Take 60 mg by mouth 2 (two) times daily.    . Fish Oil OIL Take 1 capsule by mouth 2 (two) times daily.    . furosemide (LASIX) 20 MG tablet Take 20 mg by mouth daily as needed.    Marland Kitchen levothyroxine (SYNTHROID) 112 MCG tablet Take 112 mcg by mouth daily.    Marland Kitchen levothyroxine (SYNTHROID) 50 MCG tablet Take 1 tablet (50 mcg total) by mouth every morning. 90 tablet 1  . nitroGLYCERIN (NITROSTAT) 0.4 MG SL tablet Place 1 tablet (0.4 mg total) under the tongue every 5 (five) minutes as needed for chest pain (x 3 doses). 30 tablet 1  . nystatin cream (MYCOSTATIN) Apply 1 application topically 2 (two) times daily.    . pregabalin (LYRICA) 50 MG capsule Take 50 mg by mouth 2 (two) times daily.    . simvastatin (ZOCOR) 20 MG tablet TAKE ONE (1) TABLET ONCE DAILY 90 tablet 1  . traMADol (ULTRAM) 50 MG tablet Take by mouth every 6 (six) hours as needed.    . triamcinolone cream (KENALOG) 0.1 % Apply 1 application topically as needed.     No current facility-administered medications for this visit.    Social History   Socioeconomic History  . Marital status: Widowed    Spouse name: Not on file  . Number of children: Not on file  . Years of education: Not on file  . Highest education level: Not on file  Occupational History  . Not on file  Tobacco Use  . Smoking status: Former Research scientist (life sciences)  . Smokeless tobacco: Current User    Types: Chew  . Tobacco comment: quit smoking 50 years ago. chews occas.  Substance and Sexual Activity  . Alcohol use: No  . Drug use: No  . Sexual activity: Not on file  Other Topics Concern  . Not on file  Social History Narrative  . Not on file   Social Determinants of Health   Financial Resource Strain:   . Difficulty of Paying Living Expenses:   Food Insecurity:   . Worried About Charity fundraiser in the Last Year:   . Arboriculturist in the  Last Year:   Transportation Needs:   .  Lack of Transportation (Medical):   Marland Kitchen Lack of Transportation (Non-Medical):   Physical Activity:   . Days of Exercise per Week:   . Minutes of Exercise per Session:   Stress:   . Feeling of Stress :   Social Connections:   . Frequency of Communication with Friends and Family:   . Frequency of Social Gatherings with Friends and Family:   . Attends Religious Services:   . Active Member of Clubs or Organizations:   . Attends Archivist Meetings:   Marland Kitchen Marital Status:   Intimate Partner Violence:   . Fear of Current or Ex-Partner:   . Emotionally Abused:   Marland Kitchen Physically Abused:   . Sexually Abused:    Social history is notable in that he is widowedand re-married;  has 2 children, one deceased, 5 grandchildren 8 great-grandchildren. There is no call use. He previously chewed tobacco. His wife did suffer a CVA.  History reviewed. No pertinent family history.  Both parents are deceased.  He has 4 living brothers and one deceased.  ROS General: Negative; No fevers, chills, or night sweats;  HEENT: Negative; No changes in vision or hearing, sinus congestion, difficulty swallowing Pulmonary: Negative; No cough, wheezing, shortness of breath, hemoptysis Cardiovascular: Negative; No chest pain, presyncope, syncope, palpatations GI: Negative; No nausea, vomiting, diarrhea, or abdominal pain GU: Negative; No dysuria, hematuria, or difficulty voiding Musculoskeletal: Positive for bilateral knee discomfort Hematologic/Oncology: Negative; no easy bruising, bleeding Endocrine: Negative; no heat/cold intolerance; no diabetes Neuro: Short-term memory loss Skin: Negative; No rashes or skin lesions Psychiatric: Negative; No behavioral problems, depression Sleep: Negative; No snoring, daytime sleepiness, hypersomnolence, bruxism, restless legs, hypnogognic hallucinations, no cataplexy Other comprehensive 14 point system review is negative.   PE BP  (!) 106/50 (BP Location: Left Arm, Patient Position: Sitting, Cuff Size: Normal)   Pulse 53   Ht '5\' 8"'  (1.727 m)   Wt 156 lb 9.6 oz (71 kg)   BMI 23.81 kg/m    Repeat blood pressure by me was 128/64 supine 118/60 standing  Wt Readings from Last 3 Encounters:  05/05/20 156 lb 9.6 oz (71 kg)  09/11/19 153 lb (69.4 kg)  02/12/19 161 lb (73 kg)   General: Alert, oriented, no distress.  Skin: normal turgor, no rashes, warm and dry HEENT: Normocephalic, atraumatic. Pupils equal round and reactive to light; sclera anicteric; extraocular muscles intact;  Nose without nasal septal hypertrophy Mouth/Parynx benign; Mallinpatti scale 2 Neck: No JVD, no carotid bruits; normal carotid upstroke Lungs: clear to ausculatation and percussion; no wheezing or rales Chest wall: without tenderness to palpitation Heart: PMI not displaced, RRR, s1 s2 normal, 2/6 mid peaking systolic murmur consistent with aortic stenosis, no diastolic murmur, no rubs, gallops, thrills, or heaves Abdomen: soft, nontender; no hepatosplenomehaly, BS+; abdominal aorta nontender and not dilated by palpation. Back: no CVA tenderness Pulses 2+ Musculoskeletal: full range of motion, normal strength, no joint deformities Extremities: 2+ lower extremity edema on the left 1+ on the right; no clubbing cyanosis, Homan's sign negative  Neurologic: grossly nonfocal; Cranial nerves grossly wnl Psychologic: Normal mood and affect   ECG (independently read by me): Sinus bradycardia at 53 bpm, first-degree AV block with PR interval 238 ms.  Incomplete left bundle branch block.  December 2020 ECG (independently read by me): Sinus rhythm at 67 bpm with first-degree AV block, LVH with repolarization changes.  QTc interval 477 ms  February 2020 ECG (independently read by me): Sinus bradycardia at 49 bpm, first-degree AV block,  PR interval 214 ms  December 2019 ECG (independently read by me): Atrial fibrillation with a ventricular rate in the  70s.  Poor anterior R wave progression.  QTc interval 436 ms.  May 2019 ECG (independently read by me): Sinus bradycardia with first-degree AV block.  PR interval 212 ms.  No ST segment changes.  October 2018 ECG (independently read by me): Sinus bradycardia at 52 bpm.  First-degree AV block with a PR interval of 214 ms.  Borderline voltage criteria for LVH.  QTc interval 403 ms.  No significant ST-T changes.  March 2018 ECG (independently read by me): Sinus bradycardia 57 bpm.  LVH by voltage criteria.  Nonspecific ST changes.  Normal intervals.  August 2017 ECG (independently read by me): Normal sinus rhythm at 60 bpm.  LVH by voltage criteria.  Previous deep T-wave inversion inferiorly.  Is no longer present.  ECG (independently read by me): Sinus bradycardia 53 bpm with PAC.  New significant T-wave inversion in leads 3 and aVF which was not present on his April 2016 and prior 2015 ECGs.  April 2016 ECG (independently read by me): Sinus bradycardia 50 bpm.  Poor progression V1 through V3.  No significant ST segment changes  May 2015 ECG (independently read by me): Sinus bradycardia 53 beats per minute.  PR interval 176 ms, QTc interval normal at 470 ms.  No significant ST-T changes.  Prior December 2014 ECG: Sinus rhythm at 53 beats per minute. No ectopy. Normal intervals.  LABS:  BMP Latest Ref Rng & Units 12/02/2018 07/22/2018 12/15/2016  Glucose 65 - 99 mg/dL 76 100(H) 92  BUN 8 - 27 mg/dL '17 13 16  ' Creatinine 0.76 - 1.27 mg/dL 1.50(H) 1.21 1.46(H)  BUN/Creat Ratio 10 - '24 11 11 11  ' Sodium 134 - 144 mmol/L 142 141 141  Potassium 3.5 - 5.2 mmol/L 4.8 4.6 4.8  Chloride 96 - 106 mmol/L 102 104 101  CO2 20 - 29 mmol/L '25 26 25  ' Calcium 8.6 - 10.2 mg/dL 9.4 9.2 9.7   Hepatic Function Latest Ref Rng & Units 07/22/2018 12/15/2016 01/11/2015  Total Protein 6.0 - 8.5 g/dL 6.6 6.8 7.1  Albumin 3.5 - 4.7 g/dL 4.0 4.0 4.3  AST 0 - 40 IU/L '14 20 20  ' ALT 0 - 44 IU/L '8 11 14  ' Alk Phosphatase 39 -  117 IU/L 49 42 38(L)  Total Bilirubin 0.0 - 1.2 mg/dL 0.8 0.6 0.8   CBC Latest Ref Rng & Units 07/22/2018 12/15/2016 08/31/2015  WBC 3.4 - 10.8 x10E3/uL 6.8 6.3 7.9  Hemoglobin 13.0 - 17.7 g/dL 12.3(L) 13.5 13.2  Hematocrit 37.5 - 51.0 % 35.9(L) 40.4 38.5(L)  Platelets 150 - 450 x10E3/uL 165 185 204   Lab Results  Component Value Date   MCV 95 07/22/2018   MCV 97 12/15/2016   MCV 94.4 08/31/2015   Lab Results  Component Value Date   TSH 5.260 (H) 07/22/2018   No results found for: HGBA1C   Lipid Panel     Component Value Date/Time   CHOL 110 07/22/2018 0933   TRIG 104 07/22/2018 0933   HDL 38 (L) 07/22/2018 0933   CHOLHDL 2.9 07/22/2018 0933   CHOLHDL 3.7 01/11/2015 1127   VLDL 36 01/11/2015 1127   LDLCALC 51 07/22/2018 0933   RADIOLOGY: No results found.  IMPRESSION:  1. Coronary artery disease involving native coronary artery of native heart without angina pectoris   2. Hx of CABG   3. Moderate aortic stenosis  4. Essential hypertension   5. Paroxysmal atrial fibrillation (HCC)   6. Hypothyroidism, unspecified type   7. Hyperlipidemia with target LDL less than 70     ASSESSMENT AND PLAN: Mr. Douglas Bray is an 87 year old gentleman who is status post CABG revascularization surgery In 1999.  His last nuclear perfusion study in April 2016 continued to show fairly normal perfusion without ischemia.  On his echo in August 2014 he had very mild aortic stenosis with  normal systolic function. In 2016 he was found to have deep T-wave inversion inferiorly and I performed right and left heart cardiac catheterization which revealed severe native CAD with total occlusion of the very proximal LAD after small septal perforating artery, total occlusion of a proximal left circumflex vessel after small OM vessel, total occlusion of the proximal RCA.  His LIMA graft was widely patent as was the graft supplying the distal circumflex marginal with the previously placed stent in the  midportion of the graft with intimal hyperplasia.  He had a patent vein graft to the diagonal vessel the previously placed stent that was widely patent.  The vein graft supplying the PDA remain patent.  Presently, he continues to be asymptomatic without recurrent anginal symptoms on medical therapy.  He has a murmur of aortic stenosis and on echo Doppler study this was confirmed.   His echo Doppler study from November 2019 showed an EF of 55 to 60%.  He again had a mean gradient of 12, peak gradient of 22, similar to his March 2018 study.  On his November 2019 study, aortic valve area was 1.1 cm suggestive of moderate aortic stenosis.  Presently, he denies any chest pain, presyncope or syncope.  He now has 2+ lower extremity edema particularly in the left lower extremity 1+ on the right.  I have recommended that he take furosemide at least 20 mg.  He had been given a prescription by Dr. Reesa Chew but has only taking the furosemide taking once or twice without benefit.  I have also recommended support stockings with 20 to 30 mm of support.  He is no longer on anticoagulation due to fall risk.  He had tripped yesterday leading to bruising of his crown of his head.  He will be following up with Dr. Reesa Chew next week.  With his aortic stenosis, I am recommending he undergo a follow-up echo Doppler study in 2 months and I will see him in the office in 3 months for reevaluation  Troy Sine, MD, [facility]  05/06/2020 9:54 PM

## 2020-05-06 ENCOUNTER — Encounter: Payer: Self-pay | Admitting: Cardiovascular Disease

## 2020-05-10 DIAGNOSIS — R600 Localized edema: Secondary | ICD-10-CM | POA: Diagnosis not present

## 2020-05-10 DIAGNOSIS — W19XXXD Unspecified fall, subsequent encounter: Secondary | ICD-10-CM | POA: Diagnosis not present

## 2020-05-11 DIAGNOSIS — R233 Spontaneous ecchymoses: Secondary | ICD-10-CM | POA: Diagnosis not present

## 2020-05-11 DIAGNOSIS — L821 Other seborrheic keratosis: Secondary | ICD-10-CM | POA: Diagnosis not present

## 2020-05-11 DIAGNOSIS — L570 Actinic keratosis: Secondary | ICD-10-CM | POA: Diagnosis not present

## 2020-06-22 ENCOUNTER — Other Ambulatory Visit: Payer: Self-pay

## 2020-06-22 ENCOUNTER — Ambulatory Visit (INDEPENDENT_AMBULATORY_CARE_PROVIDER_SITE_OTHER): Payer: Medicare Other

## 2020-06-22 DIAGNOSIS — I2510 Atherosclerotic heart disease of native coronary artery without angina pectoris: Secondary | ICD-10-CM | POA: Diagnosis not present

## 2020-06-22 DIAGNOSIS — I350 Nonrheumatic aortic (valve) stenosis: Secondary | ICD-10-CM

## 2020-06-22 DIAGNOSIS — I10 Essential (primary) hypertension: Secondary | ICD-10-CM | POA: Diagnosis not present

## 2020-06-22 DIAGNOSIS — I480 Paroxysmal atrial fibrillation: Secondary | ICD-10-CM | POA: Diagnosis not present

## 2020-06-22 DIAGNOSIS — Z951 Presence of aortocoronary bypass graft: Secondary | ICD-10-CM

## 2020-06-22 LAB — ECHOCARDIOGRAM COMPLETE
AR max vel: 0.99 cm2
AV Area VTI: 1.07 cm2
AV Area mean vel: 0.96 cm2
AV Mean grad: 24.5 mmHg
AV Peak grad: 41.3 mmHg
Ao pk vel: 3.22 m/s
Area-P 1/2: 1.81 cm2
S' Lateral: 3.3 cm

## 2020-06-22 NOTE — Progress Notes (Signed)
Complete echocardiogram performed.  Jimmy Alexus Michael RDCS, RVT  

## 2020-06-23 DIAGNOSIS — R627 Adult failure to thrive: Secondary | ICD-10-CM | POA: Diagnosis not present

## 2020-06-23 DIAGNOSIS — I25810 Atherosclerosis of coronary artery bypass graft(s) without angina pectoris: Secondary | ICD-10-CM | POA: Diagnosis not present

## 2020-06-23 DIAGNOSIS — I480 Paroxysmal atrial fibrillation: Secondary | ICD-10-CM | POA: Diagnosis not present

## 2020-06-23 DIAGNOSIS — R410 Disorientation, unspecified: Secondary | ICD-10-CM | POA: Diagnosis not present

## 2020-06-23 DIAGNOSIS — Z23 Encounter for immunization: Secondary | ICD-10-CM | POA: Diagnosis not present

## 2020-07-30 DIAGNOSIS — R627 Adult failure to thrive: Secondary | ICD-10-CM | POA: Diagnosis not present

## 2020-07-30 DIAGNOSIS — Z23 Encounter for immunization: Secondary | ICD-10-CM | POA: Diagnosis not present

## 2020-07-30 DIAGNOSIS — I25810 Atherosclerosis of coronary artery bypass graft(s) without angina pectoris: Secondary | ICD-10-CM | POA: Diagnosis not present

## 2020-07-30 DIAGNOSIS — I480 Paroxysmal atrial fibrillation: Secondary | ICD-10-CM | POA: Diagnosis not present

## 2020-07-30 DIAGNOSIS — E039 Hypothyroidism, unspecified: Secondary | ICD-10-CM | POA: Diagnosis not present

## 2020-07-30 DIAGNOSIS — E782 Mixed hyperlipidemia: Secondary | ICD-10-CM | POA: Diagnosis not present

## 2020-08-12 ENCOUNTER — Encounter: Payer: Self-pay | Admitting: Cardiovascular Disease

## 2020-08-12 ENCOUNTER — Ambulatory Visit: Payer: Medicare Other | Admitting: Cardiovascular Disease

## 2020-08-12 ENCOUNTER — Other Ambulatory Visit: Payer: Self-pay

## 2020-08-12 VITALS — BP 138/66 | HR 63 | Ht 68.0 in | Wt 143.0 lb

## 2020-08-12 DIAGNOSIS — I2510 Atherosclerotic heart disease of native coronary artery without angina pectoris: Secondary | ICD-10-CM | POA: Diagnosis not present

## 2020-08-12 DIAGNOSIS — I350 Nonrheumatic aortic (valve) stenosis: Secondary | ICD-10-CM

## 2020-08-12 DIAGNOSIS — I480 Paroxysmal atrial fibrillation: Secondary | ICD-10-CM | POA: Diagnosis not present

## 2020-08-12 DIAGNOSIS — E785 Hyperlipidemia, unspecified: Secondary | ICD-10-CM

## 2020-08-12 DIAGNOSIS — E039 Hypothyroidism, unspecified: Secondary | ICD-10-CM

## 2020-08-12 DIAGNOSIS — Z951 Presence of aortocoronary bypass graft: Secondary | ICD-10-CM

## 2020-08-12 NOTE — Patient Instructions (Signed)
Medication Instructions:  No Changes In Medications at this time.  *If you need a refill on your cardiac medications before your next appointment, please call your pharmacy*  Follow-Up: At [facility], you and your health needs are our priority.  As part of our continuing mission to provide you with exceptional heart care, we have created designated Provider Care Teams.  These Care Teams include your primary Cardiologist (physician) and Advanced Practice Providers (APPs -  Physician Assistants and Nurse Practitioners) who all work together to provide you with the care you need, when you need it.  We recommend signing up for the patient portal called "MyChart".  Sign up information is provided on this After Visit Summary.  MyChart is used to connect with patients for Virtual Visits (Telemedicine).  Patients are able to view lab/test results, encounter notes, upcoming appointments, etc.  Non-urgent messages can be sent to your provider as well.   To learn more about what you can do with MyChart, go to [email or web address].    Your next appointment:   6 month(s)  The format for your next appointment:   In Person  Provider:   Shelva Majestic, MD

## 2020-08-12 NOTE — Progress Notes (Signed)
Patient ID: Douglas Bray, male   DOB: 1929/07/15, 86 y.o.   MRN: [id number]     HPI: Douglas Bray is a 86 y.o. male who presents for an 3 month cardiology evaluation.  Douglas Bray has known CAD and in April 1999 underwent CABG revascularization surgery LIMA to the LAD, vein to the diagonal, vein to the marginal, and vein to the PDA as well as optional diagonal vessel. In April 2002 he underwent complex 2 vessel intervention to the diagonal vessel with stenting as well as stenting to the circumflex graft. In January 2012 echo Doppler study suggested normal systolic function with diastolic dysfunction. He had moderate to severe tricuspid regurgitation with mild pulmonary hypertension, mild aortic insufficiency, mild mitral regurgitation.  In May 2013 due to episodes of left arm discomfort a nuclear perfusion study was performed which continued to show normal perfusion.  An echo Doppler study on 05/14/2013  revealed mild LVH with normal systolic function. Diastolic parameters were normal. There was very mild aortic stenosis with mildly thickened trileaflet valve with mild AR. He had mild mitral regurgitation, moderately severe tricuspid regurgitation and mild pulmonary hypertension with a PA pressure 37 mm. His mean aortic gradient was 7 with a peak gradient of 16.  Additional problems include hypertension and hyperlipidemia.  Since I last saw him, he underwent a 2-D echo Doppler study in April 2016 which showed an ejection fraction of 55-60% with mild concentric LVH and grade 2 diastolic dysfunction.  His mean gradient across his aortic valve was 9 with a peak gradient of 17 and his aortic valve area range from 1.7-2.04 cm.  A nuclear perfusion study revealed normal perfusion.  In 2016 he was working hard and typically 12-14 hours a day.  He admits to increasing fatigue but denies chest pain or shortness of breath.  He is not resting well.  He is unaware of palpitations.  He denies PND,  orthopnea.  When I last saw him, he had developed new deep T-wave inversion inferiorly, which had not been present April 2016 at the time of his stress test.  Plavix was added to his medical rate regimen and nitrates were increased.  On 08/29/2015.  He underwent right and left heart cardiac catheterization.  He normal LV function with an EF of 55%.  His right heart pressures were normal.  There was very minimal aortic valve stenosis.  He has severe native CAD with total occlusion of the very proximal LAD after a small septal perforating artery, total occlusion of the very proximal left circumflex vessel after small OM vessel and total occlusion of the proximal RCA.  He is a patent LIMA graft supplying the mid LAD.  There was a pain, vein graft supplying the distal circumflex marginal with the previously placed stent in the midportion of the graft with intimal hyperplasia.  Narrowing of 30-40% and 50% somewhat eccentric narrowing in the more distal portion of the graft.  He had septal collaterals to the proximal LAD from the distal OM branch.  Vein graft which probably had previously supplied the optional diagonal vessel.  There was a patent vein graft to the diagonal vessel, which with previously placed stent that is widely patent.  In the midportion of the graft.  The vein graft supplying the PDA and a dominant RCA system was patent.  Medical therapy was recommended.  [place] after his wife of 81 years, 2 months and 15 days had passed away.  He 30-Aug-2023  2017, he re-married his wife's best friend.  When I last saw him, I recommended he undergo a follow-up echo Doppler study.  As was done on 01/05/2017 and essentially was unchanged from previously.  He had normal EF at 55-60%, mild LVH, grade 2 diastolic dysfunction, and mild aortic valve stenosis with mild AR.  There was mild LA dilatation and mild pulmonary hypertension.  His mean aortic gradient was 12 mm with a peak gradient of 22  mm.  He had follow-up laboratory which reconfirmed mild TSH elevation which had increased 8.95.  I called in levothyroxine initially at 25 g which was titrated to 50 ug.  When I saw him in May 2019 he was doing well and denied any chest pain, PND orthopnea, presyncope or syncope.   He had some arthritic issues.  Laboratory in October 2018 showed a total cholesterol 128, LDL 59.  Triglycerides are 158.  He had normal renal function.    When I saw him in December 2019he hadnot been as active as he had in the past. He deniedany episodes of chest pressure. He deniedany episodes of tachycardia. He had undergone an echo Doppler study in November 2019 which showed normal LV function with an EF of 55 to 60%. His aortic stenosis had progressed and was now felt to be moderate with a mean gradient of 12, peak gradient of 22, and valve area of 1.1 cm. He deniedany presyncope or syncope. When I saw him in December 2019 his ECG showed new atrial fibrillation. He was unaware of this arrhythmia. I recommended discontinuance of Plavix and started him on Eliquis 5 mg twice a day. Initiated amiodarone 200 mg daily in attempt to restore sinus rhythm. He was seen in follow-up on October 14, 2018 by Douglas Bray after he had experienced a syncopal spell where he had fallen and hit his head sustaining a laceration. He had been hospitalized at [facility] overnight. During that visit he was found to be orthostatic, quinapril was discontinued and his amiodarone was increased to 300 mg daily. He again saw Douglas Bray follow-up on October 29, 2018.  When I saw him in in February 2020, he was unaware of any sense of irregular heart rhythm. Meds included amiodarone 300 mg daily, Eliquis 5 mg twice a day, aspirin 81 mg, atenolol 12.5 mg daily in addition to levothyroxine 50 mcg and simvastatin 40 mg.   During that evaluation, he was bradycardic and his ECG showed sinus bradycardia at  49 bpm with first-degree AV. I recommended dose reduction of amiodarone back down to 200 mg daily. His Eliquis dose was reduced to 2.5 mg twice a day due to renal insufficiency and his age.  He was evaluated on Feb 12, 2019 in a telemedicine visit.  At that time he had remained stable and deniedepisodes of chest pain or shortness of breath.  Heart rate continues to be low.  He denied any episodes of presyncope or syncope or awareness of recurrent atrial fibrillation.  He tells me in the future he may need to have a knee operation.  He had an operation of one knee many years ago.    I last saw him in December 2021 and since his prior evaluation he had done well from a cardiovascular standpoint.  Apparently due to fall risk he was taken off Eliquis by Douglas Bray in October 2020.  He continued to have knee discomfort and had seen an orthopedist but does not need surgery.  He has had some  issues with short-term memory loss.  He denied chest pain, presyncope or syncope.  He denied any exertional dyspnea.   I last saw him on May 05, 2020 and since his prior evaluation  continued to experience lower extremity edema.  He is walking with a walker.  He has experienced bilateral knee discomfort.  He had fallen on May 04, 2020 and went to the ER in [place] and was found to have a hematoma on the back of his head.  Head CT was negative for bleed.  He denied any anginal symptoms.  He has a prescription for furosemide which he was given by his primary physician due to leg swelling but he has rarely taken this since it makes him urinate.  He continues to be on levothyroxine for hypothyroidism and simvastatin for hyperlipidemia.  When I saw him, he had 2+ lower extremity edema particularly involving the left greater than right.  I recommended he initiate furosemide 20 mg daily and recommended support stockings.  He was to follow-up with Douglas Bray.  I also recommended a follow-up echo Doppler study which was done on June 22, 2020.  This showed normal LV function with an EF of 60 to 65% with grade 1 diastolic dysfunction.  There was mild to moderate mitral regurgitation, moderate left atrial dilatation, and by report he was felt to have mild aortic valve stenosis with a mean gradient of 24 and peak gradient of 41 but AVA VA at 1.07 suggested moderate aortic stenosis.  Presently, he admits to some fatigue.  His leg swelling has improved.  He has been taking furosemide only as needed.  He has continued to be on amiodarone 200 mg daily and is on simvastatin 20 mg for hyperlipidemia in addition to omega-3 fatty acid.  He continues to be on levothyroxine 112 mcg daily for hypothyroidism.  He is on Aricept 5 mg at bedtime.  Recent laboratory from July 31, 2020 has shown an LDL cholesterol of 75.  Hemoglobin A1c last year was excellent at 5.5.  Recent TSH was 3.75 on July 30, 2020.  He presents for reevaluation.  Allergies  Allergen Reactions  . Ramipril Cough    Current Outpatient Medications  Medication Sig Dispense Refill  . amiodarone (PACERONE) 200 MG tablet TAKE ONE (1) TABLET ONCE DAILY 90 tablet 1  . aspirin 81 MG chewable tablet Bray by mouth daily.    . cyanocobalamin 1000 MCG tablet Take 1,000 mcg by mouth daily.    Douglas Bray Calcium (STOOL SOFTENER PO) Take by mouth. Per wife she doesn't know which one it is, But it's OTC    . donepezil (ARICEPT) 5 MG tablet Take 5 mg by mouth at bedtime.    . fexofenadine (ALLEGRA) 60 MG tablet Take 60 mg by mouth 2 (two) times daily.    . Fish Oil OIL Take 1 capsule by mouth 2 (two) times daily.    . furosemide (LASIX) 20 MG tablet Take 20 mg by mouth daily as needed.    Marland Kitchen levothyroxine (SYNTHROID) 112 MCG tablet Take 112 mcg by mouth daily.    . mirtazapine (REMERON) 15 MG tablet Take 7.5 mg by mouth at bedtime.    . nitroGLYCERIN (NITROSTAT) 0.4 MG SL tablet Place 1 tablet (0.4 mg total) under the tongue every 5 (five) minutes as needed for chest pain (x 3 doses).  30 tablet 1  . nystatin cream (MYCOSTATIN) Apply 1 application topically 2 (two) times daily.    . simvastatin (ZOCOR) 20  MG tablet TAKE ONE (1) TABLET ONCE DAILY 90 tablet 1  . traMADol (ULTRAM) 50 MG tablet Take by mouth every 6 (six) hours as needed.    . triamcinolone cream (KENALOG) 0.1 % Apply 1 application topically as needed.     No current facility-administered medications for this visit.    Social History   Socioeconomic History  . Marital status: Widowed    Spouse name: Not on file  . Number of children: Not on file  . Years of education: Not on file  . Highest education level: Not on file  Occupational History  . Not on file  Tobacco Use  . Smoking status: Former Research scientist (life sciences)  . Smokeless tobacco: Current User    Types: Bray  . Tobacco comment: quit smoking 50 years ago. chews occas.  Substance and Sexual Activity  . Alcohol use: No  . Drug use: No  . Sexual activity: Not on file  Other Topics Concern  . Not on file  Social History Narrative  . Not on file   Social Determinants of Health   Financial Resource Strain:   . Difficulty of Paying Living Expenses: Not on file  Food Insecurity:   . Worried About Charity fundraiser in the Last Year: Not on file  . Ran Out of Food in the Last Year: Not on file  Transportation Needs:   . Lack of Transportation (Medical): Not on file  . Lack of Transportation (Non-Medical): Not on file  Physical Activity:   . Days of Exercise per Week: Not on file  . Minutes of Exercise per Session: Not on file  Stress:   . Feeling of Stress : Not on file  Social Connections:   . Frequency of Communication with Friends and Family: Not on file  . Frequency of Social Gatherings with Friends and Family: Not on file  . Attends Religious Services: Not on file  . Active Member of Clubs or Organizations: Not on file  . Attends Archivist Meetings: Not on file  . Marital Status: Not on file  Intimate Partner Violence:   . Fear of  Current or Ex-Partner: Not on file  . Emotionally Abused: Not on file  . Physically Abused: Not on file  . Sexually Abused: Not on file   Social history is notable in that he is widowedand re-married;  has 2 children, one deceased, 5 grandchildren 8 great-grandchildren. There is no call use. He previously chewed tobacco. His wife did suffer a CVA.  Both parents are deceased.  He has 4 living brothers and one deceased.  ROS General: Negative; No fevers, chills, or night sweats;  HEENT: Negative; No changes in vision or hearing, sinus congestion, difficulty swallowing Pulmonary: Negative; No cough, wheezing, shortness of breath, hemoptysis Cardiovascular: Negative; No chest pain, presyncope, syncope, palpatations GI: Negative; No nausea, vomiting, diarrhea, or abdominal pain GU: Negative; No dysuria, hematuria, or difficulty voiding Musculoskeletal: Positive for bilateral knee discomfort Hematologic/Oncology: Negative; no easy bruising, bleeding Endocrine: Negative; no heat/cold intolerance; no diabetes Neuro: Short-term memory loss Skin: Negative; No rashes or skin lesions Psychiatric: Negative; No behavioral problems, depression Sleep: Negative; No snoring, daytime sleepiness, hypersomnolence, bruxism, restless legs, hypnogognic hallucinations, no cataplexy Other comprehensive 14 point system review is negative.   PE BP 138/66   Pulse 63   Ht '5\' 8"'  (1.727 m)   Wt 143 lb (64.9 kg)   SpO2 97%   BMI 21.74 kg/m    Repeat blood pressure by me was 132/68  Wt Readings from Last 3 Encounters:  08/12/20 143 lb (64.9 kg)  05/05/20 156 lb 9.6 oz (71 kg)  09/11/19 153 lb (69.4 kg)     Physical Exam BP 138/66   Pulse 63   Ht '5\' 8"'  (1.727 m)   Wt 143 lb (64.9 kg)   SpO2 97%   BMI 21.74 kg/m  General: Alert, oriented, no distress.  Skin: normal turgor, no rashes, warm and dry HEENT: Normocephalic, atraumatic. Pupils equal round and reactive to light; sclera anicteric;  extraocular muscles intact;  Nose without nasal septal hypertrophy Mouth/Parynx benign; Mallinpatti scale 2 Neck: No JVD, no carotid bruits; normal carotid upstroke Lungs: clear to ausculatation and percussion; no wheezing or rales Chest wall: without tenderness to palpitation Heart: PMI not displaced, RRR, s1 s2 normal, 2/6 systolic murmur, no diastolic murmur, no rubs, gallops, thrills, or heaves Abdomen: soft, nontender; no hepatosplenomehaly, BS+; abdominal aorta nontender and not dilated by palpation. Back: no CVA tenderness Pulses 2+ Musculoskeletal: full range of motion, normal strength, no joint deformities Extremities: Trace lower extremity edema, improved; no clubbing cyanosis, Homan's sign negative  Neurologic: grossly nonfocal; Cranial nerves grossly wnl Psychologic: Normal mood and affect   ECG (independently read by me):  NSR at 63, Nonspecific interventricular block; normal intervals  July 2021 ECG (independently read by me): Sinus bradycardia at 53 bpm, first-degree AV block with PR interval 238 ms.  Incomplete left bundle branch block.  December 2020 ECG (independently read by me): Sinus rhythm at 67 bpm with first-degree AV block, LVH with repolarization changes.  QTc interval 477 ms  February 2020 ECG (independently read by me): Sinus bradycardia at 49 bpm, first-degree AV block, PR interval 214 ms  December 2019 ECG (independently read by me): Atrial fibrillation with a ventricular rate in the 70s.  Poor anterior R wave progression.  QTc interval 436 ms.  May 2019 ECG (independently read by me): Sinus bradycardia with first-degree AV block.  PR interval 212 ms.  No ST segment changes.  October 2018 ECG (independently read by me): Sinus bradycardia at 52 bpm.  First-degree AV block with a PR interval of 214 ms.  Borderline voltage criteria for LVH.  QTc interval 403 ms.  No significant ST-T changes.  March 2018 ECG (independently read by me): Sinus bradycardia 57 bpm.   LVH by voltage criteria.  Nonspecific ST changes.  Normal intervals.  August 2017 ECG (independently read by me): Normal sinus rhythm at 60 bpm.  LVH by voltage criteria.  Previous deep T-wave inversion inferiorly.  Is no longer present.  ECG (independently read by me): Sinus bradycardia 53 bpm with PAC.  New significant T-wave inversion in leads 3 and aVF which was not present on his April 2016 and prior 2015 ECGs.  April 2016 ECG (independently read by me): Sinus bradycardia 50 bpm.  Poor progression V1 through V3.  No significant ST segment changes  May 2015 ECG (independently read by me): Sinus bradycardia 53 beats per minute.  PR interval 176 ms, QTc interval normal at 470 ms.  No significant ST-T changes.  Prior December 2014 ECG: Sinus rhythm at 53 beats per minute. No ectopy. Normal intervals.  LABS:  BMP Latest Ref Rng & Units 12/02/2018 07/22/2018 12/15/2016  Glucose 65 - 99 mg/dL 76 100(H) 92  BUN 8 - 27 mg/dL '17 13 16  ' Creatinine 0.76 - 1.27 mg/dL 1.50(H) 1.21 1.46(H)  BUN/Creat Ratio 10 - '24 11 11 11  ' Sodium 134 - 144 mmol/L 142 141 141  Potassium 3.5 - 5.2 mmol/L 4.8 4.6 4.8  Chloride 96 - 106 mmol/L 102 104 101  CO2 20 - 29 mmol/L '25 26 25  ' Calcium 8.6 - 10.2 mg/dL 9.4 9.2 9.7   Hepatic Function Latest Ref Rng & Units 07/22/2018 12/15/2016 01/11/2015  Total Protein 6.0 - 8.5 g/dL 6.6 6.8 7.1  Albumin 3.5 - 4.7 g/dL 4.0 4.0 4.3  AST 0 - 40 IU/L '14 20 20  ' ALT 0 - 44 IU/L '8 11 14  ' Alk Phosphatase 39 - 117 IU/L 49 42 38(L)  Total Bilirubin 0.0 - 1.2 mg/dL 0.8 0.6 0.8   CBC Latest Ref Rng & Units 07/22/2018 12/15/2016 08/31/2015  WBC 3.4 - 10.8 x10E3/uL 6.8 6.3 7.9  Hemoglobin 13.0 - 17.7 g/dL 12.3(L) 13.5 13.2  Hematocrit 37.5 - 51.0 % 35.9(L) 40.4 38.5(L)  Platelets 150 - 450 x10E3/uL 165 185 204   Lab Results  Component Value Date   MCV 95 07/22/2018   MCV 97 12/15/2016   MCV 94.4 08/31/2015   Lab Results  Component Value Date   TSH 5.260 (H) 07/22/2018   No  results found for: HGBA1C   Lipid Panel     Component Value Date/Time   CHOL 110 07/22/2018 0933   TRIG 104 07/22/2018 0933   HDL 38 (L) 07/22/2018 0933   CHOLHDL 2.9 07/22/2018 0933   CHOLHDL 3.7 01/11/2015 1127   VLDL 36 01/11/2015 1127   LDLCALC 51 07/22/2018 0933   RADIOLOGY: No results found.  IMPRESSION:  1. Coronary artery disease involving native coronary artery of native heart without angina pectoris   2. Hx of CABG   3. Moderate aortic stenosis   4. Hypothyroidism, unspecified type   5. Paroxysmal atrial fibrillation (HCC)   6. Hyperlipidemia with target LDL less than 70     ASSESSMENT AND PLAN: Mr. CAEDYN RAYGOZA is an 86 year old gentleman who is status post CABG revascularization surgery In 1999.  His last nuclear perfusion study in April 2016 continued to show fairly normal perfusion without ischemia.  On his echo in August 2014 he had very mild aortic stenosis with  normal systolic function. In 2016 he was found to have deep T-wave inversion inferiorly and I performed right and left heart cardiac catheterization which revealed severe native CAD with total occlusion of the very proximal LAD after small septal perforating artery, total occlusion of a proximal left circumflex vessel after small OM vessel, total occlusion of the proximal RCA.  His LIMA graft was widely patent as was the graft supplying the distal circumflex marginal with the previously placed stent in the midportion of the graft with intimal hyperplasia.  He had a patent vein graft to the diagonal vessel the previously placed stent that was widely patent.  The vein graft supplying the PDA remain patent.    He has a murmur of aortic stenosis and on echo Doppler study this was confirmed.   His echo Doppler study from November 2019 showed an EF of 55 to 60%.  He again had a mean gradient of 12, peak gradient of 22, similar to his March 2018 study.  On his November 2019 study, aortic valve area was 1.1 cm  suggestive of moderate aortic stenosis.  On his most recent echo on June 22, 2020 mean gradient was 24 with a peak gradient of 41 and estimated valve area of 1.1 suggesting moderate aortic stenosis.  He continues to have normal systolic function with EF at 60 to 65% with grade 1 diastolic  dysfunction.  There also is mild to moderate mitral regurgitation with moderate left atrial dilatation.  His peripheral edema has improved with his recent use of furosemide.  He is maintaining sinus rhythm and has continued to be on amiodarone 200 mg daily.  Thyroid function studies are normal on levothyroxine at 112 mcg with most recent TSH at 3.75.  He continues to be on simvastatin 20 mg with most recent LDL at 75.  He will continue current therapy.  I will see him in 6 months for follow-up evaluation.   Troy Sine, MD, [facility] [place]  08/16/2020 4:22 PM

## 2020-08-16 ENCOUNTER — Encounter: Payer: Self-pay | Admitting: Cardiovascular Disease

## 2020-09-04 ENCOUNTER — Other Ambulatory Visit: Payer: Self-pay | Admitting: Cardiovascular Disease

## 2020-09-06 ENCOUNTER — Other Ambulatory Visit: Payer: Self-pay | Admitting: Cardiovascular Disease

## 2020-09-06 NOTE — Telephone Encounter (Signed)
°*  STAT* If patient is at the pharmacy, call can be transferred to refill team.   1. Which medications need to be refilled? (please list name of each medication and dose if known)  simvastatin (ZOCOR) 20 MG tablet  2. Which pharmacy/location (including street and city if local pharmacy) is medication to be sent to? [place], [place]  3. Do they need a 30 day or 90 day supply? 90 day supply   Pharmacy has been waiting on the prescription for a week

## 2020-09-16 ENCOUNTER — Other Ambulatory Visit: Payer: Self-pay | Admitting: Cardiovascular Disease

## 2020-12-01 DIAGNOSIS — I480 Paroxysmal atrial fibrillation: Secondary | ICD-10-CM | POA: Diagnosis not present

## 2020-12-01 DIAGNOSIS — Z6822 Body mass index (BMI) 22.0-22.9, adult: Secondary | ICD-10-CM | POA: Diagnosis not present

## 2020-12-01 DIAGNOSIS — Z0000 Encounter for general adult medical examination without abnormal findings: Secondary | ICD-10-CM | POA: Diagnosis not present

## 2020-12-01 DIAGNOSIS — H6120 Impacted cerumen, unspecified ear: Secondary | ICD-10-CM | POA: Diagnosis not present

## 2020-12-01 DIAGNOSIS — I25708 Atherosclerosis of coronary artery bypass graft(s), unspecified, with other forms of angina pectoris: Secondary | ICD-10-CM | POA: Diagnosis not present

## 2020-12-01 DIAGNOSIS — I10 Essential (primary) hypertension: Secondary | ICD-10-CM | POA: Diagnosis not present

## 2020-12-01 DIAGNOSIS — G301 Alzheimer's disease with late onset: Secondary | ICD-10-CM | POA: Diagnosis not present

## 2020-12-01 DIAGNOSIS — E782 Mixed hyperlipidemia: Secondary | ICD-10-CM | POA: Diagnosis not present

## 2020-12-01 DIAGNOSIS — I350 Nonrheumatic aortic (valve) stenosis: Secondary | ICD-10-CM | POA: Diagnosis not present

## 2020-12-01 DIAGNOSIS — R627 Adult failure to thrive: Secondary | ICD-10-CM | POA: Diagnosis not present

## 2020-12-22 ENCOUNTER — Telehealth: Payer: Self-pay | Admitting: Cardiovascular Disease

## 2020-12-22 NOTE — Telephone Encounter (Signed)
Patient would like to be seen where is wife is seen by Dr. Bettina Gavia.  Please okay this provider switch!  Thank you  Ammie Dalton

## 2020-12-22 NOTE — Telephone Encounter (Signed)
OK with me.

## 2020-12-24 NOTE — Telephone Encounter (Signed)
Okay with me 

## 2021-01-06 DIAGNOSIS — I10 Essential (primary) hypertension: Secondary | ICD-10-CM | POA: Diagnosis not present

## 2021-01-06 DIAGNOSIS — E785 Hyperlipidemia, unspecified: Secondary | ICD-10-CM | POA: Diagnosis not present

## 2021-02-02 DIAGNOSIS — N189 Chronic kidney disease, unspecified: Secondary | ICD-10-CM | POA: Diagnosis not present

## 2021-02-02 DIAGNOSIS — Z951 Presence of aortocoronary bypass graft: Secondary | ICD-10-CM | POA: Diagnosis not present

## 2021-02-02 DIAGNOSIS — I517 Cardiomegaly: Secondary | ICD-10-CM | POA: Diagnosis not present

## 2021-02-02 DIAGNOSIS — R6889 Other general symptoms and signs: Secondary | ICD-10-CM | POA: Diagnosis not present

## 2021-02-02 DIAGNOSIS — R42 Dizziness and giddiness: Secondary | ICD-10-CM | POA: Diagnosis not present

## 2021-02-02 DIAGNOSIS — R0902 Hypoxemia: Secondary | ICD-10-CM | POA: Diagnosis not present

## 2021-02-02 DIAGNOSIS — R531 Weakness: Secondary | ICD-10-CM | POA: Diagnosis not present

## 2021-02-02 DIAGNOSIS — I129 Hypertensive chronic kidney disease with stage 1 through stage 4 chronic kidney disease, or unspecified chronic kidney disease: Secondary | ICD-10-CM | POA: Diagnosis not present

## 2021-02-02 DIAGNOSIS — R21 Rash and other nonspecific skin eruption: Secondary | ICD-10-CM | POA: Diagnosis not present

## 2021-02-02 DIAGNOSIS — I499 Cardiac arrhythmia, unspecified: Secondary | ICD-10-CM | POA: Diagnosis not present

## 2021-02-02 DIAGNOSIS — J9811 Atelectasis: Secondary | ICD-10-CM | POA: Diagnosis not present

## 2021-02-02 DIAGNOSIS — I4891 Unspecified atrial fibrillation: Secondary | ICD-10-CM | POA: Diagnosis not present

## 2021-02-02 DIAGNOSIS — E039 Hypothyroidism, unspecified: Secondary | ICD-10-CM | POA: Diagnosis not present

## 2021-02-02 DIAGNOSIS — Z743 Need for continuous supervision: Secondary | ICD-10-CM | POA: Diagnosis not present

## 2021-02-02 DIAGNOSIS — Z79899 Other long term (current) drug therapy: Secondary | ICD-10-CM | POA: Diagnosis not present

## 2021-02-11 DIAGNOSIS — R531 Weakness: Secondary | ICD-10-CM | POA: Diagnosis not present

## 2021-02-11 DIAGNOSIS — L89219 Pressure ulcer of right hip, unspecified stage: Secondary | ICD-10-CM | POA: Diagnosis not present

## 2021-04-04 ENCOUNTER — Ambulatory Visit: Payer: Medicare Other | Admitting: Cardiology

## 2021-04-08 DEATH — deceased
